# Patient Record
Sex: Female | Born: 1990 | Race: White | Hispanic: No | Marital: Single | Smoking: Never smoker
Health system: Southern US, Community
[De-identification: ages and names within clinical notes are randomized; demographics above are authoritative.]

## PROBLEM LIST (undated history)

## (undated) DIAGNOSIS — R010 Benign and innocent cardiac murmurs: Secondary | ICD-10-CM

## (undated) DIAGNOSIS — K5904 Chronic idiopathic constipation: Secondary | ICD-10-CM

## (undated) HISTORY — DX: Chronic idiopathic constipation: K59.04

## (undated) HISTORY — DX: Benign and innocent cardiac murmurs: R01.0

---
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
## 2012-10-05 ENCOUNTER — Ambulatory Visit (INDEPENDENT_AMBULATORY_CARE_PROVIDER_SITE_OTHER): Payer: BC Managed Care – PPO | Admitting: Internal Medicine

## 2012-10-05 ENCOUNTER — Encounter: Payer: Self-pay | Admitting: Internal Medicine

## 2012-10-05 VITALS — BP 114/60 | HR 99 | Temp 98.4°F | Wt 148.0 lb

## 2012-10-05 DIAGNOSIS — R5381 Other malaise: Secondary | ICD-10-CM

## 2012-10-05 DIAGNOSIS — R599 Enlarged lymph nodes, unspecified: Secondary | ICD-10-CM

## 2012-10-05 DIAGNOSIS — R52 Pain, unspecified: Secondary | ICD-10-CM

## 2012-10-05 DIAGNOSIS — R5383 Other fatigue: Secondary | ICD-10-CM

## 2012-10-05 NOTE — Patient Instructions (Signed)
This acts like a viral syndrome like mono but possible another virus causing this .   Fatigue can last for weeks . Fortunately your exam is good today. Get [place] copy of all  Of the labs done  And I can review and give advice about any further action  In the short run.

## 2012-10-05 NOTE — Progress Notes (Signed)
Chief Complaint  Patient presents with  . Fatigue    Ongoing since the beginning of February.  Had a mono test at school which came back negative.  . Generalized Body Aches  . Headache    HPI: Patient comes in today for SDA for  new problem evaluation.  Home on spring break from [place] . However has actually been having problem since early FEbruary .  Was prev evaluated  At school had strep with st and cough?  Reported positive tests.  Had cough up some blood.   bgrp   b strep in throat  And had 2 rounds or antibiotics; amoxicillin and then   z pack?    Felt feverish.   Throat and cough got  Better and unsure if  Continued sx are related to this. Had a cold sx.  And now very tired and achy muscles but no high fever  No joint pain or new rash   ? Swollen glands at times       ROS: See pertinent positives and negatives per HPI.no bleeding gi gu changes hearing vision neuro sx.   No chronic meds    Headache is all over non severe no neuro sx with this and could be related to above no sig depression mood issues  Neg tad other exposures  Past Medical History  Diagnosis Date  . Functional murmur   . 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 No pertinent family history.  History   Social History  . Marital Status: Single    Spouse Name: N/A    Number of Children: N/A  . Years of Education: N/A   Social History Main Topics  . Smoking status: Never Smoker   . Smokeless tobacco: None  . Alcohol Use: None  . Drug Use: None  . Sexually Active: None   Other Topics Concern  . None   Social History Narrative   Single   Sleep 6 hours   Caffeine   HH of 3   Pet dog   Born in [place]    [employer]    Outpatient Encounter Prescriptions as of 10/05/2012  Medication Sig Dispense Refill  . Prenatal Vit-Fe Psac Cmplx-FA (PRENATAL MULTIVITAMIN) 60-1 MG tablet Take 1 tablet by mouth daily with breakfast.        . [DISCONTINUED] norethindrone-ethinyl  estradiol (JUNEL FE 1/20) 1-20 MG-MCG per tablet Take 1 tablet by mouth daily.         No facility-administered encounter medications on file as of 10/05/2012.    EXAM:  BP 114/60  Pulse 99  Temp(Src) 98.4 F (36.9 C) (Oral)  Wt 148 lb (67.132 kg)  BMI 24.63 kg/m2  SpO2 97%  LMP 09/18/2012  Body mass index is 24.63 kg/(m^2).  GENERAL: vitals reviewed and listed above, alert, oriented, appears well hydrated and in no acute distress  lokoks well but a bit tired non toxic and not chromically ill   HEENT: Normocephalic ;atraumatic , Eyes;  PERRL, EOMs  Full, lids and conjunctiva clear,,Ears: no deformities, canals nl, TM landmarks normal, Nose: no deformity or discharge  Mouth : OP clear without lesion or edema .  NECK: no obvious masses on inspection palpation  Shoddy ac  Nodes   LUNGS: clear to auscultation bilaterally, no wheezes, rales or rhonchi, good air movement  CV: HRRR, no g or m  no clubbing cyanosis or  peripheral edema nl cap refill  Abdomen:  Sof,t normal bowel sounds without hepatosplenomegaly, no guarding rebound or masses no CVA tenderness MS: moves all extremities without noticeable focal  Abnormality no redness or warmth  Skin: normal capillary refill ,turgor , color: No acute rashes ,petechiae or bruising NEURO: oriented x 3 CN 3-12 appear intact. No focal muscle weakness or atrophy. DTRs symmetrical. Gait WNL.  Grossly non focal. No tremor or abnormal movement.  PSYCH: pleasant and cooperative, no obvious depression or anxiety  ASSESSMENT AND PLAN:  Discussed the following assessment and plan:  Other malaise and fatigue - Plan: CANCELED: POC Mono (Epstein Barr Virus)  Body aches  Glands swollen - Plan: CANCELED: POC Mono (Epstein Barr Virus) Exam is unrevealing and reassuring . No sig adenopathy noted  This sounds mono like     But apparently has had extensive blood work done     rx for strep and other   Sounds more likea  Post viral illness.  In lieu of  more blood tests today   ( she reviewed some on the phone  From [facility] but no numbers viewable to me.  ) get copy of labs sent to [place] for review and I can give opino about other evaluations.    Although she was rx for strep there is no sx exam cw post strep sequelae -Patient advised to return or notify health care team  if symptoms worsen or persist or new concerns arise.  Patient Instructions  This acts like a viral syndrome like mono but possible another virus causing this .   Fatigue can last for weeks . Fortunately your exam is good today. Get [place] copy of all  Of the labs done  And I can review and give advice about any further action  In the short run.      Neta Mends. Panosh M.D.

## 2012-10-10 ENCOUNTER — Encounter: Payer: Self-pay | Admitting: Internal Medicine

## 2012-10-21 ENCOUNTER — Telehealth: Payer: Self-pay | Admitting: Internal Medicine

## 2012-10-21 NOTE — Telephone Encounter (Signed)
Pls advise.  

## 2012-10-21 NOTE — Telephone Encounter (Signed)
Please see what i wrot on the lab papers  Sent to [facility]    There was no ebv serologies  Done and other labs not there

## 2012-10-21 NOTE — Telephone Encounter (Signed)
Orders faxed to [facility]

## 2012-10-21 NOTE — Telephone Encounter (Signed)
Called and spoke with pt and advised that per Dr Fabian Sharp pt did not have ebv serologies performed and she would have requested BMP, TSH, and LFTS. Advised pt to ask [facility] if this can be done there and to get a fax number.  Advised pt to call back and give fax number to office.

## 2012-10-21 NOTE — Telephone Encounter (Signed)
MOM Erica Mckenzie called to verify MD would order through [facility] what labs she wanted, reviewed note in EPIC, and that Erica Mckenzie had provided fax number for use to [facility].

## 2012-10-21 NOTE — Telephone Encounter (Signed)
Here is the information requested.

## 2012-10-21 NOTE — Telephone Encounter (Signed)
Lab orders faxed to [facility].

## 2012-10-21 NOTE — Telephone Encounter (Signed)
Pt doesn't know who she just spoke with. She stated whomever it was, requested for her to get them this Fax # - [phone] [facility]. If it was you, please assist.

## 2012-10-21 NOTE — Telephone Encounter (Signed)
lPatient Information:  Caller Name: Erica Mckenzie  Phone: [phone]  Patient: Erica Mckenzie, Erica Mckenzie  Gender: Female  DOB: [birth year]  Age: 22 Years  PCP: Berniece Andreas [facility])   Does the office need to follow up with this patient?: Yes  Instructions For The Office: recommendations after review of records from [facility]  RN Note:  states has not heard back regarding labs that were done and requested at last office visit   Reason For Call & Symptoms: review of blood work done [facility], brought to office,  due to being tired and achy, no improvement, using Monistat for yeast around mouth, soreness with walking, weight gain of 5 lbs in past week.   Reviewed Health History In EMR: Yes  Reviewed Medications In EMR: Yes  Reviewed Allergies In EMR: Yes  Reviewed Surgeries / Procedures: Yes  Date of Onset of Symptoms: 09/22/2012 GYN:  LMP: Unknown  Guideline(s) Used:  No Protocol Available - Sick Adult  Disposition Per Guideline:  Discuss with PCP and Callback by Nurse Today  Reason For Disposition Reached:  Nursing judgment  Advice Given:  Call Back If:  New symptoms develop;  You become worse.  Patient Will Follow Care Advice: Verbalizes understanding

## 2012-10-21 NOTE — Telephone Encounter (Signed)
Caller: Sandra/Mother; Phone: [phone]; Reason for Call: Mom calling for reassurance that office is following up on earlier call.  Informed nurse Bonita Quin, she will follow up w/mom.

## 2012-10-28 ENCOUNTER — Telehealth: Payer: Self-pay | Admitting: Internal Medicine

## 2012-10-28 NOTE — Telephone Encounter (Signed)
Patient called stating that she would like a call back with results from the test results sent over by her school. Please assist.

## 2012-10-28 NOTE — Telephone Encounter (Signed)
Received labs which show a normal thyroid test and her mono antibodies were all negative which means she has never had mononucleosis by EBV her liver and kidney functions are also normal.   This is reassuring and still doesn't give [place] a diagnosis of why she feels badly.  Her symptoms really sounded like a mono which are negative by tests   if she is having recurrent swollen glands and sore throat we may get ear nose and throat Dr. To see her but I would rather see her physically again before referral.  Please have her documented she is still getting fevers at night and what temperature that would be.

## 2012-10-29 NOTE — Telephone Encounter (Signed)
Spoke to the pt and informed her of test results.  She will speak to her mom and make an appt with Dr. Fabian Sharp before referral to ENT.  She will call back to make appt.

## 2012-11-03 ENCOUNTER — Telehealth: Payer: Self-pay | Admitting: Internal Medicine

## 2012-11-03 NOTE — Telephone Encounter (Signed)
Rec'd from [facility] forward 40 pages to [facility] 11/03/12

## 2014-01-21 ENCOUNTER — Other Ambulatory Visit: Payer: BC Managed Care – PPO

## 2014-01-26 ENCOUNTER — Encounter: Payer: BC Managed Care – PPO | Admitting: Internal Medicine

## 2014-03-24 ENCOUNTER — Encounter: Payer: Self-pay | Admitting: Family Medicine

## 2014-03-24 ENCOUNTER — Ambulatory Visit (INDEPENDENT_AMBULATORY_CARE_PROVIDER_SITE_OTHER): Payer: BC Managed Care – PPO | Admitting: Family Medicine

## 2014-03-24 VITALS — BP 122/76 | HR 66 | Ht 66.0 in | Wt 154.0 lb

## 2014-03-24 DIAGNOSIS — M7062 Trochanteric bursitis, left hip: Secondary | ICD-10-CM

## 2014-03-24 DIAGNOSIS — M76899 Other specified enthesopathies of unspecified lower limb, excluding foot: Secondary | ICD-10-CM

## 2014-03-24 NOTE — Patient Instructions (Signed)
Very nice to meet you Ice 20 minutes 2 times daily. Usually after activity and before bed. Try the pennsaid twice daily for next 3 days then as needed.  Home exercises 3 times a week.  Make sure iron  daily.  See me again in 3 weeks.

## 2014-03-24 NOTE — Assessment & Plan Note (Signed)
The patient does have a mild case of bursitis. We discussed that this is mostly secondary to muscle imbalances. Patient was given home exercises the patient will do a regular basis. We discussed icing protocol as well as over-the-counter medications and to be helpful. Patient was given a trial of some topical anti-inflammatories. Patient will try these changes and come back and see me again in 3 weeks for further evaluation.

## 2014-03-24 NOTE — Progress Notes (Signed)
  [facility] [address] [place], [place] [address] Phone: [phone] Subjective:      CC: Left hip pain  VHQ:[id number] Erica Mckenzie is a 23 y.o. female coming in with complaint of left hip pain. Patient states that she's had this pain intermittently for quite some time recently and seem to be worsening. Patient states it is on the lateral aspect of the hip and denies any groin or back pain associated with it. Patient notices it more when sitting. Patient states sometimes with walking or running he can give her some mild discomfort. Patient denies any waking up at night. Patient has tried ibuprofen which has been helpful. Patient is not taking any medications and denies possibility of being pregnant. Patient denies any association with food and denies any radiation down the leg or any numbness or weakness. Severity 5/10.     Past medical history, social, surgical and family history all reviewed in electronic medical record.   Review of Systems: No headache, visual changes, nausea, vomiting, diarrhea, constipation, dizziness, abdominal pain, skin rash, fevers, chills, night sweats, weight loss, swollen lymph nodes, body aches, joint swelling, muscle aches, chest pain, shortness of breath, mood changes.   Objective Blood pressure 122/76, pulse 66, height  (1.676 m), weight 154 lb (69.854 kg), SpO2 98.00%.  General: No apparent distress alert and oriented x3 mood and affect normal, dressed appropriately.  HEENT: Pupils equal, extraocular movements intact  Respiratory: Patient's speak in full sentences and does not appear short of breath  Cardiovascular: No lower extremity edema, non tender, no erythema  Skin: Warm dry intact with no signs of infection or rash on extremities or on axial skeleton.  Abdomen: Soft nontender  Neuro: Cranial nerves II through XII are intact, neurovascularly intact in all extremities with 2+ DTRs and 2+ pulses.  Lymph: No  lymphadenopathy of posterior or anterior cervical chain or axillae bilaterally.  Gait normal with good balance and coordination.  MSK:  Non tender with full range of motion and good stability and symmetric strength and tone of shoulders, elbows, wrist,  knee and ankles bilaterally.  Hip: left ROM IR: 35 Deg, ER: 35 Deg, Flexion: 120 Deg, Extension: 100 Deg, Abduction: 45 Deg, Adduction: 45 Deg Strength IR: 5/5, ER: 5/5, Flexion: 5/5, Extension: 5/5, Abduction: 5/5, Adduction: 5/5 Pelvic alignment unremarkable to inspection and palpation. Standing hip rotation and gait without trendelenburg sign / unsteadiness. Greater trochanter with moderate tenderness.  No tenderness over piriformis and greater trochanter. No pain with FABER or FADIR. No SI joint tenderness and normal minimal SI movement.    Impression and Recommendations:     This case required medical decision making of moderate complexity.

## 2014-04-06 ENCOUNTER — Other Ambulatory Visit (INDEPENDENT_AMBULATORY_CARE_PROVIDER_SITE_OTHER): Payer: BC Managed Care – PPO

## 2014-04-06 DIAGNOSIS — R5381 Other malaise: Secondary | ICD-10-CM

## 2014-04-06 DIAGNOSIS — Z0000 Encounter for general adult medical examination without abnormal findings: Secondary | ICD-10-CM

## 2014-04-06 DIAGNOSIS — R5383 Other fatigue: Secondary | ICD-10-CM

## 2014-04-06 LAB — BASIC METABOLIC PANEL
BUN: 9 mg/dL (ref 6–23)
CALCIUM: 9.2 mg/dL (ref 8.4–10.5)
CO2: 27 mEq/L (ref 19–32)
CREATININE: 0.6 mg/dL (ref 0.4–1.2)
Chloride: 107 mEq/L (ref 96–112)
GFR: 146.21 mL/min (ref >60.00)
GLUCOSE: 77 mg/dL (ref 70–99)
Potassium: 4.2 mEq/L (ref 3.5–5.1)
SODIUM: 142 meq/L (ref 135–145)

## 2014-04-06 LAB — TSH: TSH: 2.3 u[IU]/mL (ref 0.35–4.50)

## 2014-04-06 LAB — HEPATIC FUNCTION PANEL
ALT: 19 U/L (ref 0–35)
AST: 28 U/L (ref 0–37)
Albumin: 4 g/dL (ref 3.5–5.2)
Alkaline Phosphatase: 52 U/L (ref 39–117)
BILIRUBIN DIRECT: 0.1 mg/dL (ref 0.0–0.3)
BILIRUBIN TOTAL: 0.4 mg/dL (ref 0.2–1.2)
Total Protein: 7.2 g/dL (ref 6.0–8.3)

## 2014-04-06 LAB — LIPID PANEL
CHOLESTEROL: 159 mg/dL (ref 0–200)
HDL: 43.6 mg/dL (ref >39.00)
LDL Cholesterol: 103 mg/dL — ABNORMAL HIGH (ref 0–99)
NonHDL: 115.4
Total CHOL/HDL Ratio: 4
Triglycerides: 61 mg/dL (ref 0.0–149.0)
VLDL: 12.2 mg/dL (ref 0.0–40.0)

## 2014-04-06 LAB — CBC WITH DIFFERENTIAL/PLATELET
BASOS PCT: 0.7 % (ref 0.0–3.0)
Basophils Absolute: 0 10*3/uL (ref 0.0–0.1)
EOS ABS: 0.1 10*3/uL (ref 0.0–0.7)
Eosinophils Relative: 2 % (ref 0.0–5.0)
HCT: 39.9 % (ref 36.0–46.0)
Hemoglobin: 13.4 g/dL (ref 12.0–15.0)
LYMPHS ABS: 2.6 10*3/uL (ref 0.7–4.0)
Lymphocytes Relative: 44.4 % (ref 12.0–46.0)
MCHC: 33.6 g/dL (ref 30.0–36.0)
MCV: 86.5 fl (ref 78.0–100.0)
MONO ABS: 0.5 10*3/uL (ref 0.1–1.0)
Monocytes Relative: 7.9 % (ref 3.0–12.0)
NEUTROS PCT: 45 % (ref 43.0–77.0)
Neutro Abs: 2.7 10*3/uL (ref 1.4–7.7)
PLATELETS: 249 10*3/uL (ref 150.0–400.0)
RBC: 4.61 Mil/uL (ref 3.87–5.11)
RDW: 12.4 % (ref 11.5–15.5)
WBC: 5.9 10*3/uL (ref 4.0–10.5)

## 2014-04-06 LAB — IBC PANEL
Iron: 77 ug/dL (ref 42–145)
SATURATION RATIOS: 19.7 % — AB (ref 20.0–50.0)
Transferrin: 279.1 mg/dL (ref 212.0–360.0)

## 2014-04-06 LAB — FERRITIN: Ferritin: 16.4 ng/mL (ref 10.0–291.0)

## 2014-04-06 NOTE — Addendum Note (Signed)
Addended by: Alfred Levins D on: 04/06/2014 08:50 AM   Modules accepted: Orders

## 2014-04-13 ENCOUNTER — Encounter: Payer: Self-pay | Admitting: Internal Medicine

## 2014-04-13 ENCOUNTER — Ambulatory Visit (INDEPENDENT_AMBULATORY_CARE_PROVIDER_SITE_OTHER): Payer: BC Managed Care – PPO | Admitting: Internal Medicine

## 2014-04-13 VITALS — BP 116/64 | Temp 98.2°F | Ht 65.5 in | Wt 150.6 lb

## 2014-04-13 DIAGNOSIS — E611 Iron deficiency: Secondary | ICD-10-CM | POA: Insufficient documentation

## 2014-04-13 DIAGNOSIS — Z23 Encounter for immunization: Secondary | ICD-10-CM

## 2014-04-13 DIAGNOSIS — D509 Iron deficiency anemia, unspecified: Secondary | ICD-10-CM

## 2014-04-13 DIAGNOSIS — Z0000 Encounter for general adult medical examination without abnormal findings: Secondary | ICD-10-CM

## 2014-04-13 DIAGNOSIS — Z113 Encounter for screening for infections with a predominantly sexual mode of transmission: Secondary | ICD-10-CM

## 2014-04-13 NOTE — Patient Instructions (Signed)
Continue lifestyle intervention healthy eating and exercise . Healthy lifestyle includes : At least 150 minutes of exercise weeks  , weight at healthy levels, which is usually   BMI 19-25. Avoid trans fats and processed foods;  Increase fresh fruits and veges to 5 servings per day. And avoid sweet beverages including tea and juice. Mediterranean diet with olive oil and nuts have been noted to be heart and brain healthy . Avoid tobacco products . Limit  alcohol to  7 per week for women and 14 servings for men.  Get adequate sleep . Wear seat belts . Don't text and drive .   Will notify you  of labs when available. Wellness visit in a year  . Iron level is borderline.  Can take  Multivitamin with iron  Increase iron in diet    Iron-Rich Diet An iron-rich diet contains foods that are good sources of iron. Iron is an important mineral that helps your body produce hemoglobin. Hemoglobin is a protein in red blood cells that carries oxygen to the body's tissues. Sometimes, the iron level in your blood can be low. This may be caused by:  A lack of iron in your diet.  Blood loss.  Times of growth, such as during pregnancy or during a child's growth and development. Low levels of iron can cause a decrease in the number of red blood cells. This can result in iron deficiency anemia. Iron deficiency anemia symptoms include:  Tiredness.  Weakness.  Irritability.  Increased chance of infection. Here are some recommendations for daily iron intake:  Males older than 22 years of age need 8 mg of iron per day.  Women ages 42 to 88 need 18 mg of iron per day.  Pregnant women need 27 mg of iron per day, and women who are over 83 years of age and breastfeeding need 9 mg of iron per day.  Women over the age of 50 need 8 mg of iron per day. SOURCES OF IRON There are 2 types of iron that are found in food: heme iron and nonheme iron. Heme iron is absorbed by the body better than nonheme iron. Heme  iron is found in meat, poultry, and fish. Nonheme iron is found in grains, beans, and vegetables. Heme Iron Sources Food / Iron (mg)  Chicken liver, 3 oz (85 g)/ 10 mg  Beef liver, 3 oz (85 g)/ 5.5 mg  Oysters, 3 oz (85 g)/ 8 mg  Beef, 3 oz (85 g)/ 2 to 3 mg  Shrimp, 3 oz (85 g)/ 2.8 mg  [place], 3 oz (85 g)/ 2 mg  Chicken, 3 oz (85 g) / 1 mg  Fish (tuna, halibut), 3 oz (85 g)/ 1 mg  Pork, 3 oz (85 g)/ 0.9 mg Nonheme Iron Sources Food / Iron (mg)  Ready-to-eat breakfast cereal, iron-fortified / 3.9 to 7 mg  Tofu,  cup / 3.4 mg  Kidney beans,  cup / 2.6 mg  Baked potato with skin / 2.7 mg  Asparagus,  cup / 2.2 mg  Avocado / 2 mg  Dried peaches,  cup / 1.6 mg  Raisins,  cup / 1.5 mg  Soy milk, 1 cup / 1.5 mg  Whole-wheat bread, 1 slice / 1.2 mg  Spinach, 1 cup / 0.8 mg  Broccoli,  cup / 0.6 mg IRON ABSORPTION Certain foods can decrease the body's absorption of iron. Try to avoid these foods and beverages while eating meals with iron-containing foods:  Coffee.  Tea.  Fiber.  Soy. Foods containing vitamin C can help increase the amount of iron your body absorbs from iron sources, especially from nonheme sources. Eat foods with vitamin C along with iron-containing foods to increase your iron absorption. Foods that are high in vitamin C include many fruits and vegetables. Some good sources are:  Fresh orange juice.  Oranges.  Strawberries.  Mangoes.  Grapefruit.  Red bell peppers.  Green bell peppers.  Broccoli.  Potatoes with skin.  Tomato juice. Document Released: 02/26/2005 Document Revised: 10/07/2011 Document Reviewed: 01/03/2011 [facility] Patient Information 2015 [place], [place]. This information is not intended to replace advice given to you by your health care provider. Make sure you discuss any questions you have with your health care provider.

## 2014-04-13 NOTE — Progress Notes (Signed)
Pre visit review using our clinic review tool, if applicable. No additional management support is needed unless otherwise documented below in the visit note.  Chief Complaint  Patient presents with  . Annual Exam    HPI: Patient comes in today for Preventive Health Care visit . Just graduated from [employer] back at home looking for employment. On ocps relationship ended 1.5 years  No sx .periods ok  Hx of iron deficiency Had pap last year gyne Health Maintenance  Topic Date Due  . Influenza Vaccine  03/30/2015 (Originally 02/26/2014)  . Pap Smear  02/27/2016  . Tetanus/tdap  04/13/2024   Health Maintenance Review LIFESTYLE:  Exercise:  Working with trainer   Trying to lose  From college  Tobacco/ETS:  no Alcohol:  ocass beer  Sugar beverages: ocass.  Sleep: 7-8 hours  Drug use: no  ROS:  Some bloated  Esp with red meat..  Periods regular  3 days .  GEN/ HEENT: No fever, significant weight changes sweats headaches vision problems hearing changes, CV/ PULM; No chest pain shortness of breath cough, syncope,edema  change in exercise tolerance. GI /GU: No adominal pain, vomiting, change in bowel habits. No blood in the stool. No significant GU symptoms. SKIN/HEME: ,no acute skin rashes suspicious lesions or bleeding. No lymphadenopathy, nodules, masses.  NEURO/ PSYCH:  No neurologic signs such as weakness numbness. No depression anxiety. IMM/ Allergy: No unusual infections.  Allergy .   REST of 12 system review negative except as per HPI   Past Medical History  Diagnosis Date  . Functional murmur   . 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 No pertinent family history.  History   Social History  . Marital Status: Single    Spouse Name: N/A    Number of Children: N/A  . Years of Education: N/A   Social History Main Topics  . Smoking status: Never Smoker   . Smokeless tobacco: None  . Alcohol Use: None  . Drug Use: None  . Sexual Activity:  None   Other Topics Concern  . 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 JUNEL FE 1.5/30 1.5-30 MG-MCG tablet   . [DISCONTINUED] Prenatal Vit-Fe Psac Cmplx-FA (PRENATAL MULTIVITAMIN) 60-1 MG tablet Take 1 tablet by mouth daily with breakfast.      EXAM:  BP 116/64  Temp(Src) 98.2 F (36.8 C) (Oral)  Ht 5' 5.5" (1.664 m)  Wt 150 lb 9.6 oz (68.312 kg)  BMI 24.67 kg/m2  Body mass index is 24.67 kg/(m^2).  Physical Exam: Vital signs reviewed [id number]:WRUE is a well-developed well-nourished alert cooperative    who appearsr stated age in no acute distress.  HEENT: normocephalic atraumatic , Eyes: PERRL EOM's full, conjunctiva clear, Nares: paten,t no deformity discharge or tenderness., Ears: no deformity EAC's clear TMs with normal landmarks. Mouth: clear OP, no lesions, edema.  Moist mucous membranes. Dentition in adequate repair. NECK: supple without masses, thyromegaly or bruits. CHEST/PULM:  Clear to auscultation and percussion breath sounds equal no wheeze , rales or rhonchi. No chest wall deformities or tenderness. CV: PMI is nondisplaced, S1 S2 no gallops, murmurs, rubs. Peripheral pulses are full without delay.No JVD .  Breast: normal by inspection . No dimpling, discharge, masses, tenderness or discharge . ABDOMEN: Bowel sounds normal nontender  No guard or rebound, no hepato splenomegal no CVA tenderness.  No hernia. Extremtities:  No clubbing cyanosis or edema, no acute joint swelling or redness no focal atrophy NEURO:  Oriented x3, cranial nerves 3-12 appear to be intact, no obvious focal weakness,gait within normal limits no abnormal reflexes or asymmetrical SKIN: No acute rashes normal turgor, color, no bruising or petechiae. PSYCH: Oriented, good eye contact, no obvious  depression anxiety, cognition and judgment appear normal. LN: no cervical axillary inguinal adenopathy   Lab Results  Component Value Date   WBC 5.9 04/06/2014   HGB 13.4 04/06/2014   HCT 39.9 04/06/2014   PLT 249.0 04/06/2014   GLUCOSE 77 04/06/2014   CHOL 159 04/06/2014   TRIG 61.0 04/06/2014   HDL 43.60 04/06/2014   LDLCALC 103* 04/06/2014   ALT 19 04/06/2014   AST 28 04/06/2014   NA 142 04/06/2014   K 4.2 04/06/2014   CL 107 04/06/2014   CREATININE 0.6 04/06/2014   BUN 9 04/06/2014   CO2 27 04/06/2014   TSH 2.30 04/06/2014    ASSESSMENT AND PLAN:  Discussed the following assessment and plan:  Visit for preventive health examination - utd lab reviewed   Need for tetanus booster - Plan: Td vaccine greater than or equal to 7yo preservative free IM  Screening for STD (sexually transmitted disease) - Plan: GC/chlamydia probe amp, urine  Iron deficiency - minimal not eating red meat .   Patient Care Team: Madelin Headings, MD as PCP - General Patient Instructions  Continue lifestyle intervention healthy eating and exercise . Healthy lifestyle includes : At least 150 minutes of exercise weeks  , weight at healthy levels, which is usually   BMI 19-25. Avoid trans fats and processed foods;  Increase fresh fruits and veges to 5 servings per day. And avoid sweet beverages including tea and juice. Mediterranean diet with olive oil and nuts have been noted to be heart and brain healthy . Avoid tobacco products . Limit  alcohol to  7 per week for women and 14 servings for men.  Get adequate sleep . Wear seat belts . Don't text and drive .   Will notify you  of labs when available. Wellness visit in a year  . Iron level is borderline.  Can take  Multivitamin with iron  Increase iron in diet    Iron-Rich Diet An iron-rich diet contains foods that are good sources of iron. Iron is an important mineral that helps your body produce hemoglobin. Hemoglobin is a protein in red blood cells that carries oxygen to the  body's tissues. Sometimes, the iron level in your blood can be low. This may be caused by:  A lack of iron in your diet.  Blood loss.  Times of growth, such as during pregnancy or during a child's growth and development. Low levels of iron can cause a decrease in the number of red blood cells. This can result in iron deficiency anemia. Iron deficiency anemia symptoms include:  Tiredness.  Weakness.  Irritability.  Increased chance of infection. Here are some recommendations for daily iron intake:  Males older than 22 years of age need 8 mg of iron per day.  Women ages 59 to 13 need 18 mg of iron per day.  Pregnant women need 27 mg of iron per day, and women who are over 38 years of age and  breastfeeding need 9 mg of iron per day.  Women over the age of 24 need 8 mg of iron per day. SOURCES OF IRON There are 2 types of iron that are found in food: heme iron and nonheme iron. Heme iron is absorbed by the body better than nonheme iron. Heme iron is found in meat, poultry, and fish. Nonheme iron is found in grains, beans, and vegetables. Heme Iron Sources Food / Iron (mg)  Chicken liver, 3 oz (85 g)/ 10 mg  Beef liver, 3 oz (85 g)/ 5.5 mg  Oysters, 3 oz (85 g)/ 8 mg  Beef, 3 oz (85 g)/ 2 to 3 mg  Shrimp, 3 oz (85 g)/ 2.8 mg  [place], 3 oz (85 g)/ 2 mg  Chicken, 3 oz (85 g) / 1 mg  Fish (tuna, halibut), 3 oz (85 g)/ 1 mg  Pork, 3 oz (85 g)/ 0.9 mg Nonheme Iron Sources Food / Iron (mg)  Ready-to-eat breakfast cereal, iron-fortified / 3.9 to 7 mg  Tofu,  cup / 3.4 mg  Kidney beans,  cup / 2.6 mg  Baked potato with skin / 2.7 mg  Asparagus,  cup / 2.2 mg  Avocado / 2 mg  Dried peaches,  cup / 1.6 mg  Raisins,  cup / 1.5 mg  Soy milk, 1 cup / 1.5 mg  Whole-wheat bread, 1 slice / 1.2 mg  Spinach, 1 cup / 0.8 mg  Broccoli,  cup / 0.6 mg IRON ABSORPTION Certain foods can decrease the body's absorption of iron. Try to avoid these foods and beverages  while eating meals with iron-containing foods:  Coffee.  Tea.  Fiber.  Soy. Foods containing vitamin C can help increase the amount of iron your body absorbs from iron sources, especially from nonheme sources. Eat foods with vitamin C along with iron-containing foods to increase your iron absorption. Foods that are high in vitamin C include many fruits and vegetables. Some good sources are:  Fresh orange juice.  Oranges.  Strawberries.  Mangoes.  Grapefruit.  Red bell peppers.  Green bell peppers.  Broccoli.  Potatoes with skin.  Tomato juice. Document Released: 02/26/2005 Document Revised: 10/07/2011 Document Reviewed: 01/03/2011 [facility] Patient Information 2015 [place], [place]. This information is not intended to replace advice given to you by your health care provider. Make sure you discuss any questions you have with your health care provider.     Neta Mends. Panosh M.D.

## 2014-04-14 ENCOUNTER — Encounter: Payer: Self-pay | Admitting: Family Medicine

## 2014-04-14 ENCOUNTER — Ambulatory Visit (INDEPENDENT_AMBULATORY_CARE_PROVIDER_SITE_OTHER): Payer: BC Managed Care – PPO | Admitting: Family Medicine

## 2014-04-14 VITALS — BP 106/64 | HR 85 | Ht 65.5 in | Wt 150.0 lb

## 2014-04-14 DIAGNOSIS — M76899 Other specified enthesopathies of unspecified lower limb, excluding foot: Secondary | ICD-10-CM

## 2014-04-14 DIAGNOSIS — M7062 Trochanteric bursitis, left hip: Secondary | ICD-10-CM

## 2014-04-14 LAB — GC/CHLAMYDIA PROBE AMP, URINE
Chlamydia, Swab/Urine, PCR: NEGATIVE
GC Probe Amp, Urine: NEGATIVE

## 2014-04-14 NOTE — Progress Notes (Signed)
  [facility] [address] [place], [place] [address] Phone: [phone] Subjective:      CC: Left hip pain  BJY:[id number] Erica Mckenzie is a 23 y.o. female coming in with complaint of left hip pain. Patient was seen previously and did have a mild greater trochanteric bursitis. Patient has been doing the exercises fairly regularly but has not been icing and taking the medication. Patient states though that she is approximately 70% better. Patient states after sitting for long amount of time she can still has some mild discomfort. Patient has started running again and is not having any significant pain. Patient continues to try to avoid any medications because she is contemplating becoming pregnant. Patient states that there may be some association with food.     Past medical history, social, surgical and family history all reviewed in electronic medical record.   Review of Systems: No headache, visual changes, nausea, vomiting, diarrhea, constipation, dizziness, abdominal pain, skin rash, fevers, chills, night sweats, weight loss, swollen lymph nodes, body aches, joint swelling, muscle aches, chest pain, shortness of breath, mood changes.   Objective Blood pressure 106/64, pulse 85, height 5' 5.5" (1.664 m), weight 150 lb (68.04 kg), SpO2 98.00%.  General: No apparent distress alert and oriented x3 mood and affect normal, dressed appropriately.  HEENT: Pupils equal, extraocular movements intact  Respiratory: Patient's speak in full sentences and does not appear short of breath  Cardiovascular: No lower extremity edema, non tender, no erythema  Skin: Warm dry intact with no signs of infection or rash on extremities or on axial skeleton.  Abdomen: Soft nontender  Neuro: Cranial nerves II through XII are intact, neurovascularly intact in all extremities with 2+ DTRs and 2+ pulses.  Lymph: No lymphadenopathy of posterior or anterior cervical chain or axillae  bilaterally.  Gait normal with good balance and coordination.  MSK:  Non tender with full range of motion and good stability and symmetric strength and tone of shoulders, elbows, wrist,  knee and ankles bilaterally.  Hip: left ROM IR: 35 Deg, ER: 35 Deg, Flexion: 120 Deg, Extension: 100 Deg, Abduction: 45 Deg, Adduction: 45 Deg Strength IR: 5/5, ER: 5/5, Flexion: 5/5, Extension: 5/5, Abduction: 5/5, Adduction: 5/5 Pelvic alignment unremarkable to inspection and palpation. Standing hip rotation and gait without trendelenburg sign / unsteadiness. Greater trochanter w nontender No tenderness over piriformis and greater trochanter. No pain with FABER or FADIR. No SI joint tenderness and normal minimal SI movement.    Impression and Recommendations:     This case required medical decision making of moderate complexity.

## 2014-04-14 NOTE — Assessment & Plan Note (Signed)
Patient does have some mild symptoms to left. Encourage her to do the exercises on a regular basis as well as to start icing on a regular basis. We discussed continuing exercises for the next 6 weeks. We discussed the importance of core strengthening as well as the hip abductor and making as part of her regular routine. Patient will come back and see me again in 6 weeks if pain has not completely resolved.

## 2014-04-14 NOTE — Patient Instructions (Signed)
Good to see you If hurting ibuprofen or meloxicam  Keep a food diary and see if there is any association.  Exercises 3 times a week for another 6 weeks Ice is your friend at night/ Come back and see me again in 6 weeks if not perfect.

## 2014-04-15 ENCOUNTER — Encounter: Payer: Self-pay | Admitting: Family Medicine

## 2014-05-30 ENCOUNTER — Ambulatory Visit (INDEPENDENT_AMBULATORY_CARE_PROVIDER_SITE_OTHER): Payer: BC Managed Care – PPO | Admitting: Family Medicine

## 2014-05-30 ENCOUNTER — Encounter: Payer: Self-pay | Admitting: Family Medicine

## 2014-05-30 VITALS — BP 122/82 | HR 76 | Ht 66.0 in | Wt 146.0 lb

## 2014-05-30 DIAGNOSIS — M7062 Trochanteric bursitis, left hip: Secondary | ICD-10-CM

## 2014-05-30 DIAGNOSIS — M2170 Unequal limb length (acquired), unspecified site: Secondary | ICD-10-CM

## 2014-05-30 NOTE — Patient Instructions (Signed)
Good to see you Ice is your friend still after activity Tennis ball back left pocket with sitting Make sure you stretch hip flexor after squatting Heel lift in left shoe If pain is not gone in 4 weeks come back and we will try some osteopathic manipulation.

## 2014-05-30 NOTE — Assessment & Plan Note (Signed)
Patient is doing remarkably well with conservative therapy. Patient was given manual massage techniques think will be helpful as well as we discussed about fixing the leg length discrepancy. Patient will try heel lifts. We also discussed stretching patient's hip flexors. Patient was doing remarkably well and as long as she continues to do well we will see her again in about 4-6 weeks. Patient continues to have any difficulty we'll consider osteopathic manipulation.

## 2014-05-30 NOTE — Progress Notes (Signed)
  [facility] [address] [place], [place] [address] Phone: [phone] Subjective:      CC: Left hip pain follow up  BJY:NWGNFAOZHYHPI:Subjective Erica PicklesJamie Mckenzie is a 23 y.o. female coming in with complaint of left hip pain. Patient was found to have a mild greater trochanteric bursitis that was doing significantly well with conservative therapy. Patient states overall she is approximately 96% better. Patient states that after sitting a long amount of time she has a dull aching sensation on the lateral aspect of the hip with no radiation. This is not stopping her from any activity and she is able to work out on a regular basis. Patient denies any new symptoms.     Past medical history, social, surgical and family history all reviewed in electronic medical record.   Review of Systems: No headache, visual changes, nausea, vomiting, diarrhea, constipation, dizziness, abdominal pain, skin rash, fevers, chills, night sweats, weight loss, swollen lymph nodes, body aches, joint swelling, muscle aches, chest pain, shortness of breath, mood changes.   Objective Blood pressure 122/82, pulse 76, height 5\' 6"  (1.676 m), weight 146 lb (66.225 kg), SpO2 99 %.  General: No apparent distress alert and oriented x3 mood and affect normal, dressed appropriately.  HEENT: Pupils equal, extraocular movements intact  Respiratory: Patient's speak in full sentences and does not appear short of breath  Cardiovascular: No lower extremity edema, non tender, no erythema  Skin: Warm dry intact with no signs of infection or rash on extremities or on axial skeleton.  Abdomen: Soft nontender  Neuro: Cranial nerves II through XII are intact, neurovascularly intact in all extremities with 2+ DTRs and 2+ pulses.  Lymph: No lymphadenopathy of posterior or anterior cervical chain or axillae bilaterally.  Gait normal with good balance and coordination.  MSK:  Non tender with full range of motion and good  stability and symmetric strength and tone of shoulders, elbows, wrist,  knee and ankles bilaterally.  Hip: left ROM IR: 35 Deg, ER: 35 Deg, Flexion: 120 Deg, Extension: 100 Deg, Abduction: 45 Deg, Adduction: 45 Deg Strength IR: 5/5, ER: 5/5, Flexion: 5/5, Extension: 5/5, Abduction: 5/5, Adduction: 5/5 Pelvic alignment unremarkable to inspection and palpation. Standing hip rotation and gait without trendelenburg sign / unsteadiness. Greater trochanter w nontender No tenderness over piriformis and greater trochanter. No pain with FABER or FADIR. No SI joint tenderness and normal minimal SI movement. 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   Impression and Recommendations:     This case required medical decision making of moderate complexity.

## 2014-06-01 ENCOUNTER — Other Ambulatory Visit: Payer: Self-pay | Admitting: Internal Medicine

## 2014-06-01 NOTE — Telephone Encounter (Signed)
Denied.  Last OV states no bc at this time.  No mention of refilling any bc.  Sent a message to the pharmacy to have the pt call the office if a refill is needed.

## 2014-06-03 MED ORDER — NORETHIN ACE-ETH ESTRAD-FE 1.5-30 MG-MCG PO TABS: 1.0000 | ORAL_TABLET | Freq: Every day | ORAL | Status: DC

## 2014-06-03 NOTE — Telephone Encounter (Signed)
Ok to rx  through next welness visit  Sept 2016

## 2014-06-03 NOTE — Addendum Note (Signed)
Addended by: Raj JanusADKINS, Louellen Haldeman T on: 06/03/2014 04:14 PM   Modules accepted: Orders

## 2014-06-03 NOTE — Telephone Encounter (Signed)
Pt called and is requesting a refill.  Originally prescribed by the college.  Please advise.  Thanks!

## 2014-06-03 NOTE — Telephone Encounter (Signed)
Sent to the pharmacy by e-scribe. 

## 2014-06-30 ENCOUNTER — Ambulatory Visit: Payer: BC Managed Care – PPO | Admitting: Family Medicine

## 2015-01-20 ENCOUNTER — Ambulatory Visit (INDEPENDENT_AMBULATORY_CARE_PROVIDER_SITE_OTHER): Payer: BC Managed Care – PPO | Admitting: Family Medicine

## 2015-01-20 ENCOUNTER — Encounter: Payer: Self-pay | Admitting: Family Medicine

## 2015-01-20 ENCOUNTER — Other Ambulatory Visit (INDEPENDENT_AMBULATORY_CARE_PROVIDER_SITE_OTHER): Payer: BC Managed Care – PPO

## 2015-01-20 VITALS — BP 104/72 | HR 82 | Ht 66.0 in | Wt 154.0 lb

## 2015-01-20 DIAGNOSIS — M65842 Other synovitis and tenosynovitis, left hand: Secondary | ICD-10-CM

## 2015-01-20 DIAGNOSIS — M25531 Pain in right wrist: Secondary | ICD-10-CM | POA: Diagnosis not present

## 2015-01-20 DIAGNOSIS — M65832 Other synovitis and tenosynovitis, left forearm: Secondary | ICD-10-CM

## 2015-01-20 DIAGNOSIS — M65839 Other synovitis and tenosynovitis, unspecified forearm: Secondary | ICD-10-CM | POA: Insufficient documentation

## 2015-01-20 NOTE — Progress Notes (Signed)
  [facility] [address] [place], [place] [address] Phone: [phone] Subjective:    I'm seeing this patient by the request  of:  Lorretta Harp, MD   CC: Wrist pain, right  [id number] Erica Mckenzie is a 24 y.o. female coming in with complaint of wrist pain on the right side. Patient states that this is been going on intermittently for months but seems to be worsening. Patient states that anytime she extends her wrist with either weight lifting or pushing. Patient denies any true injury. Patient states that her regular daily activities she is not having any significant discomfort. Patient states that it does seem to be worsening. Denies any numbness or weakness. Patient rates the severity of pain as 5 out of 10. Starting to affect her working out. No nighttime awakening.     Past medical history, social, surgical and family history all reviewed in electronic medical record.   Review of Systems: No headache, visual changes, nausea, vomiting, diarrhea, constipation, dizziness, abdominal pain, skin rash, fevers, chills, night sweats, weight loss, swollen lymph nodes, body aches, joint swelling, muscle aches, chest pain, shortness of breath, mood changes.   Objective Blood pressure 104/72, pulse 82, height 5\' 6"  (1.676 m), weight 154 lb (69.854 kg), SpO2 98 %.  General: No apparent distress alert and oriented x3 mood and affect normal, dressed appropriately.  HEENT: Pupils equal, extraocular movements intact  Respiratory: Patient's speak in full sentences and does not appear short of breath  Cardiovascular: No lower extremity edema, non tender, no erythema  Skin: Warm dry intact with no signs of infection or rash on extremities or on axial skeleton.  Abdomen: Soft nontender  Neuro: Cranial nerves II through XII are intact, neurovascularly intact in all extremities with 2+ DTRs and 2+ pulses.  Lymph: No lymphadenopathy of posterior or anterior cervical  chain or axillae bilaterally.  Gait normal with good balance and coordination.  MSK:  Non tender with full range of motion and good stability and symmetric strength and tone of shoulders, elbows, , hip, knee and ankles bilaterally.  Wrist: Right Inspection normal with no visible erythema or swelling. ROM smooth and normal with good flexion and extension and ulnar/radial deviation that is symmetrical with opposite wrist. Palpation is normal over metacarpals, navicular, lunate, and TFCC; tendons without tenderness/ swelling No snuffbox tenderness. No tenderness over Canal of Guyon. Strength 5/5 in all directions without pain. Patient though does have pain with forced extension Negative Finkelstein, tinel's and phalens. Negative Watson's test. Collateral wrist unremarkable.  MSK US performed of: Right wrist This study was ordered, performed, and interpreted by Terrilee Files D.O.  Wrist: All extensor compartments visualized patient does have hypoechoic changes within the third compartment. No tear appreciated  TFCC intact. Scapholunate ligament intact. Carpal tunnel visualized and median nerve area normal, flexor tendons all normal in appearance without fraying, tears, or sheath effusions. Power doppler signal normal.  IMPRESSION:  Extensor tenosynovitis      Impression and Recommendations:     This case required medical decision making of moderate complexity.

## 2015-01-20 NOTE — Patient Instructions (Signed)
Good to see you Ice 20 minutes 2 times daily. Usually after activity and before bed. Exercises 3 times a week.  Lift with thumbs up Elbows up with typing. Puppet master pennsaid pinkie amount topically 2 times daily as needed.  See me again in 3-4 weeks.

## 2015-01-20 NOTE — Assessment & Plan Note (Signed)
Believe second due to patient's typing as well as patient's lifting and yoga is likely contributed into the tenosynovitis. We discussed icing, topical anti-inflammatory's, home exercises. We discussed possibly changing different positioning with her daily activities that I think will be beneficial. Patient try these different changes and come back and see me again in 3 weeks for further evaluation and treatment.  Spent  25 minutes with patient face-to-face and had greater than 50% of counseling including as described above in assessment and plan.

## 2015-01-20 NOTE — Progress Notes (Signed)
Pre visit review using our clinic review tool, if applicable. No additional management support is needed unless otherwise documented below in the visit note. 

## 2015-02-22 ENCOUNTER — Ambulatory Visit: Payer: BC Managed Care – PPO | Admitting: Family Medicine

## 2015-03-08 ENCOUNTER — Other Ambulatory Visit: Payer: Self-pay | Admitting: Internal Medicine

## 2015-04-14 ENCOUNTER — Other Ambulatory Visit (INDEPENDENT_AMBULATORY_CARE_PROVIDER_SITE_OTHER): Payer: BC Managed Care – PPO

## 2015-04-14 DIAGNOSIS — Z0000 Encounter for general adult medical examination without abnormal findings: Secondary | ICD-10-CM | POA: Diagnosis not present

## 2015-04-14 LAB — CBC WITH DIFFERENTIAL/PLATELET
BASOS PCT: 0.6 % (ref 0.0–3.0)
Basophils Absolute: 0 10*3/uL (ref 0.0–0.1)
EOS ABS: 0.1 10*3/uL (ref 0.0–0.7)
EOS PCT: 0.9 % (ref 0.0–5.0)
HEMATOCRIT: 39.8 % (ref 36.0–46.0)
Hemoglobin: 13.5 g/dL (ref 12.0–15.0)
LYMPHS PCT: 31.6 % (ref 12.0–46.0)
Lymphs Abs: 2.5 10*3/uL (ref 0.7–4.0)
MCHC: 34 g/dL (ref 30.0–36.0)
MCV: 85.6 fl (ref 78.0–100.0)
Monocytes Absolute: 0.5 10*3/uL (ref 0.1–1.0)
Monocytes Relative: 6.9 % (ref 3.0–12.0)
NEUTROS ABS: 4.7 10*3/uL (ref 1.4–7.7)
Neutrophils Relative %: 60 % (ref 43.0–77.0)
PLATELETS: 244 10*3/uL (ref 150.0–400.0)
RBC: 4.65 Mil/uL (ref 3.87–5.11)
RDW: 12.3 % (ref 11.5–15.5)
WBC: 7.9 10*3/uL (ref 4.0–10.5)

## 2015-04-14 LAB — LIPID PANEL
CHOLESTEROL: 141 mg/dL (ref 0–200)
HDL: 48.5 mg/dL (ref >39.00)
LDL Cholesterol: 79 mg/dL (ref 0–99)
NonHDL: 92.86
TRIGLYCERIDES: 70 mg/dL (ref 0.0–149.0)
Total CHOL/HDL Ratio: 3
VLDL: 14 mg/dL (ref 0.0–40.0)

## 2015-04-14 LAB — HEPATIC FUNCTION PANEL
ALBUMIN: 4.4 g/dL (ref 3.5–5.2)
ALT: 17 U/L (ref 0–35)
AST: 20 U/L (ref 0–37)
Alkaline Phosphatase: 55 U/L (ref 39–117)
BILIRUBIN DIRECT: 0.1 mg/dL (ref 0.0–0.3)
TOTAL PROTEIN: 7.4 g/dL (ref 6.0–8.3)
Total Bilirubin: 0.4 mg/dL (ref 0.2–1.2)

## 2015-04-14 LAB — BASIC METABOLIC PANEL
BUN: 11 mg/dL (ref 6–23)
CHLORIDE: 103 meq/L (ref 96–112)
CO2: 28 meq/L (ref 19–32)
CREATININE: 0.59 mg/dL (ref 0.40–1.20)
Calcium: 9.8 mg/dL (ref 8.4–10.5)
GFR: 133.62 mL/min (ref >60.00)
Glucose, Bld: 83 mg/dL (ref 70–99)
POTASSIUM: 4.5 meq/L (ref 3.5–5.1)
Sodium: 138 mEq/L (ref 135–145)

## 2015-04-14 LAB — TSH: TSH: 2 u[IU]/mL (ref 0.35–4.50)

## 2015-04-21 ENCOUNTER — Ambulatory Visit (INDEPENDENT_AMBULATORY_CARE_PROVIDER_SITE_OTHER): Payer: BC Managed Care – PPO | Admitting: Internal Medicine

## 2015-04-21 ENCOUNTER — Encounter: Payer: Self-pay | Admitting: Internal Medicine

## 2015-04-21 VITALS — BP 122/80 | Temp 98.4°F | Ht 66.0 in | Wt 148.1 lb

## 2015-04-21 DIAGNOSIS — Z113 Encounter for screening for infections with a predominantly sexual mode of transmission: Secondary | ICD-10-CM

## 2015-04-21 DIAGNOSIS — IMO0002 Reserved for concepts with insufficient information to code with codable children: Secondary | ICD-10-CM

## 2015-04-21 DIAGNOSIS — N941 Dyspareunia: Secondary | ICD-10-CM

## 2015-04-21 DIAGNOSIS — Z3041 Encounter for surveillance of contraceptive pills: Secondary | ICD-10-CM

## 2015-04-21 DIAGNOSIS — Z0000 Encounter for general adult medical examination without abnormal findings: Secondary | ICD-10-CM

## 2015-04-21 LAB — POCT URINALYSIS DIPSTICK
BILIRUBIN UA: NEGATIVE
Blood, UA: NEGATIVE
GLUCOSE UA: NEGATIVE
LEUKOCYTES UA: NEGATIVE
NITRITE UA: NEGATIVE
PH UA: 6
Protein, UA: NEGATIVE
Spec Grav, UA: 1.025
Urobilinogen, UA: 0.2

## 2015-04-21 NOTE — Progress Notes (Signed)
Pre visit review using our clinic review tool, if applicable. No additional management support is needed unless otherwise documented below in the visit note.  Chief Complaint  Patient presents with  . Annual Exam    HPI: Patient  Erica Mckenzie  24 y.o. comes in today for Preventive Health Care visit  Now works  [employer]  40 week.  Lives at home  New health concern  Vaginal dyspareunia and some bloating   For 6 months   No dc uti sx  Fever other abd pain  Last pap ? Dr Mariana Arn office  In 2014  Not sure if constipated   Health Maintenance  Topic Date Due  . HIV Screening  09/29/2006  . INFLUENZA VACCINE  04/19/2016 (Originally 02/27/2015)  . PAP SMEAR  02/27/2016  . TETANUS/TDAP  04/13/2024   Health Maintenance Review LIFESTYLE:  Exercise:  Sometimes  Wrist   Tobacco/ETS:no Alcohol:  1 per week  Sugar beverages: no Sleep: ave 7  Drug use: no LMP  : early September .   Regular mostly  One missed . 1 partner a year  No condoms  Had hiv screen in past  ROS:  GEN/ HEENT: No fever, significant weight changes sweats headaches vision problems hearing changes, CV/ PULM; No chest pain shortness of breath cough, syncope,edema  change in exercise tolerance. GI /GU: See above  No adominal pain, vomiting, change in bowel habits. No blood in the stool. SKIN/HEME: ,no acute skin rashes suspicious lesions or bleeding. No lymphadenopathy, nodules, masses.  NEURO/ PSYCH:  No neurologic signs such as weakness numbness. No depression anxiety. IMM/ Allergy: No unusual infections.  Allergy .   REST of 12 system review negative except as per HPI   Past Medical History  Diagnosis Date  . Functional murmur   . 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  Family History  Problem Relation Age of Onset  . Healthy Mother     Social History   Social History  . Marital Status: Single    Spouse Name: N/A  . Number of Children: N/A  . Years of Education: N/A    Social History Main Topics  . Smoking status: Never Smoker   . Smokeless tobacco: None  . Alcohol Use: None  . Drug Use: None  . Sexual Activity: Not Asked   Other Topics Concern  . 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 JUNEL FE 1.5/30 1.5-30 MG-MCG tablet TAKE 1 TABLET BY MOUTH DAILY. 28 tablet 1   No facility-administered medications prior to visit.     EXAM:  BP 122/80 mmHg  Temp(Src) 98.4 F (36.9 C) (Oral)  Ht  (1.676 m)  Wt 148 lb 1.6 oz (67.178 kg)  BMI 23.92 kg/m2  Body mass index is 23.92 kg/(m^2).  Physical Exam: Vital signs reviewed WUJ:WJXB is a well-developed well-nourished alert cooperative    who appearsr stated age in no acute distress.  HEENT: normocephalic atraumatic , Eyes: PERRL EOM's full, conjunctiva clear, Nares: paten,t no deformity discharge or tenderness., Ears: no deformity EAC's clear TMs with normal landmarks. Mouth:  clear OP, no lesions, edema.  Moist mucous membranes. Dentition in adequate repair. NECK: supple without masses, thyromegaly or bruits. CHEST/PULM:  Clear to auscultation and percussion breath sounds equal no wheeze , rales or rhonchi. No chest wall deformities or tenderness.Breast: normal by inspection . No dimpling, discharge, masses, tenderness or discharge . CV: PMI is nondisplaced, S1 S2 no gallops, murmurs, rubs. Peripheral pulses are full without delay.No JVD .  ABDOMEN: Bowel sounds normal nontender  No guard or rebound, no hepato splenomegal no CVA tenderness. GU ext gu normal no adenopathy no dc  Extremtities:  No clubbing cyanosis or edema, no acute joint swelling or redness no focal atrophy NEURO:  Oriented x3, cranial nerves 3-12 appear to be intact, no obvious focal  weakness,gait within normal limits no abnormal reflexes or asymmetrical SKIN: No acute rashes normal turgor, color, no bruising or petechiae. PSYCH: Oriented, good eye contact, no obvious depression anxiety, cognition and judgment appear normal. LN: no cervical axillary inguinal adenopathy  Lab Results  Component Value Date   WBC 7.9 04/14/2015   HGB 13.5 04/14/2015   HCT 39.8 04/14/2015   PLT 244.0 04/14/2015   GLUCOSE 83 04/14/2015   CHOL 141 04/14/2015   TRIG 70.0 04/14/2015   HDL 48.50 04/14/2015   LDLCALC 79 04/14/2015   ALT 17 04/14/2015   AST 20 04/14/2015   NA 138 04/14/2015   K 4.5 04/14/2015   CL 103 04/14/2015   CREATININE 0.59 04/14/2015   BUN 11 04/14/2015   CO2 28 04/14/2015   TSH 2.00 04/14/2015   Adult vaccines due  Topic Date Due  . TETANUS/TDAP  04/13/2024     ASSESSMENT AND PLAN:  Discussed the following assessment and plan:  Visit for preventive health examination - Plan: GC/chlamydia probe amp, urine, POCT urinalysis dipstick  Routine screening for STI (sexually transmitted infection) - Plan: GC/chlamydia probe amp, urine, POCT urinalysis dipstick  Dyspareunia - ext exam looks normal  vaginal / dsic diff dx refer back to he gyne  - Plan: Ambulatory referral to Gynecology  Oral contraceptive use  Patient Care Team: Madelin Headings, MD as PCP - General Patient Instructions  Exam is good. Healthy eating to avoid constipation. Will notify you  of labs when available.  Will do a referral to Dr. Mariana Arn office about the dyspareunia sx.etc  ? If change ocps would help.       Neta Mends. Panosh M.D.

## 2015-04-21 NOTE — Patient Instructions (Signed)
Exam is good. Healthy eating to avoid constipation. Will notify you  of labs when available.  Will do a referral to Dr. Mariana Arn office about the dyspareunia sx.etc  ? If change ocps would help.

## 2015-04-22 LAB — GC/CHLAMYDIA PROBE AMP, URINE
Chlamydia, Swab/Urine, PCR: NEGATIVE
GC PROBE AMP, URINE: NEGATIVE

## 2015-05-03 ENCOUNTER — Other Ambulatory Visit: Payer: Self-pay | Admitting: Internal Medicine

## 2015-05-03 NOTE — Telephone Encounter (Signed)
Sent to the pharmacy by e-scribe. 

## 2016-01-08 ENCOUNTER — Other Ambulatory Visit: Payer: Self-pay | Admitting: Internal Medicine

## 2016-01-08 NOTE — Telephone Encounter (Signed)
Rx refill sent to pharmacy. 

## 2016-06-24 ENCOUNTER — Encounter: Payer: Self-pay | Admitting: Internal Medicine

## 2016-06-24 ENCOUNTER — Ambulatory Visit (INDEPENDENT_AMBULATORY_CARE_PROVIDER_SITE_OTHER): Payer: BC Managed Care – PPO | Admitting: Internal Medicine

## 2016-06-24 VITALS — BP 130/86 | Temp 98.1°F | Wt 153.4 lb

## 2016-06-24 DIAGNOSIS — J069 Acute upper respiratory infection, unspecified: Secondary | ICD-10-CM | POA: Diagnosis not present

## 2016-06-24 DIAGNOSIS — J019 Acute sinusitis, unspecified: Secondary | ICD-10-CM | POA: Diagnosis not present

## 2016-06-24 MED ORDER — AMOXICILLIN-POT CLAVULANATE 875-125 MG PO TABS: 1.0000 | ORAL_TABLET | Freq: Two times a day (BID) | ORAL | 0 refills | Status: DC

## 2016-06-24 NOTE — Progress Notes (Signed)
Pre visit review using our clinic review tool, if applicable. No additional management support is needed unless otherwise documented below in the visit note.  Chief Complaint  Patient presents with  . Sinus Pressure    Started early November.  . Nasal Congestion  . Headache  . Chills    HPI: Erica Mckenzie 25 y.o. sda  Comes in today after the onset of upper respiratory congestion like a cold on the first or second of November. She didn't have a fever at that time some nasal congestion and cough clear phlegm. Had been done to improve but over the last day or so felt worse not sure if it was a new illness or the old. Now has a headache I frontal some nausea and stomachache with it. No vomiting or diarrhea chills but no fever. Still has nasal congestion but mostly clear discharge. No swollen glands no history of mono. No major sore throat.  ROS: See pertinent positives and negatives per HPI.  Past Medical History:  Diagnosis Date  . 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 Functional murmur     Family History  Problem Relation Age of Onset  . Healthy Mother     Social History   Social History  . Marital status: Single    Spouse name: N/A  . Number of children: N/A  . Years of education: N/A   Social History Main Topics  . Smoking status: Never Smoker  . Smokeless tobacco: None  . Alcohol use None  . Drug use: Unknown  . Sexual activity: Not Asked   Other Topics Concern  . 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 JUNEL FE 1.5/30 1.5-30 MG-MCG tablet TAKE 1 TABLET BY MOUTH DAILY. 84 tablet 1   No facility-administered medications prior to visit.      EXAM:  BP 130/86 (BP Location: Right Arm, Patient Position: Sitting, Cuff  Size: Normal)   Temp 98.1 F (36.7 C) (Oral)   Wt 153 lb 6.4 oz (69.6 kg)   BMI 24.76 kg/m   Body mass index is 24.76 kg/m.  GENERAL: vitals reviewed and listed above, alert, oriented, appears well hydrated and in no acute distressShe has some mild congestion but is able to breathe through her nose. Nontoxic. HEENT: atraumatic, conjunctiva  clear, no obvious abnormalities on inspection of external nose and ears TMs normal landmarks nares mucoid discharge face tender bifrontal area OP : no lesion edema or exudate  NECK: no obvious masses on inspection palpation shoddy before meals nodes negative PC nodes LUNGS: clear to auscultation bilaterally, no wheezes, rales or rhonchi, good air movement CV: HRRR, no clubbing cyanosis or  peripheral edema nl cap refill  MS: moves all extremities without noticeable focal  abnormality PSYCH: pleasant and cooperative, no obvious depression or anxiety  ASSESSMENT AND PLAN:  Discussed the following assessment and plan:  Protracted URI - 3-4 weeks  some relapsing sx see text   Acute sinusitis with symptoms greater than 10 days - probabl diagnosis Worsening new symptoms could be a second illness versus early sinusitis. Exam is reassuring. Explanation for congestion Tylenol versus Advil if not  improving add antibiotic as we discussed sign up for my chart keep us posted if more advice needed or follow-up. Expectant management discussed. -Patient advised to return or notify health care team  if symptoms worsen ,persist or new concerns arise.  Patient Instructions   symptoms sound like a relapsing sinusitis. Most sinus infections get better on their own with time however if relapsing or lasting long time adding an antibiotic can be helpful.  For now add nasal saline a decongestant such as Sudafed or Mucinex D in the day, and nasal cortisone such as Flonase or Nasacort 2 sprays each side of the nose once a day. If not improving in the next 2-3 days add the  antibiotic treatment. If getting worse or concerned fevers contact us for advice. Your ears are not infected in your chest exam is clear today.    Sinusitis, Adult Sinusitis is soreness and inflammation of your sinuses. Sinuses are hollow spaces in the bones around your face. Your sinuses are located:  Around your eyes.  In the middle of your forehead.  Behind your nose.  In your cheekbones. Your sinuses and nasal passages are lined with a stringy fluid (mucus). Mucus normally drains out of your sinuses. When your nasal tissues become inflamed or swollen, the mucus can become trapped or blocked so air cannot flow through your sinuses. This allows bacteria, viruses, and funguses to grow, which leads to infection. Sinusitis can develop quickly and last for 7?10 days (acute) or for more than 12 weeks (chronic). Sinusitis often develops after a cold. What are the causes? This condition is caused by anything that creates swelling in the sinuses or stops mucus from draining, including:  Allergies.  Asthma.  Bacterial or viral infection.  Abnormally shaped bones between the nasal passages.  Nasal growths that contain mucus (nasal polyps).  Narrow sinus openings.  Pollutants, such as chemicals or irritants in the air.  A foreign object stuck in the nose.  A fungal infection. This is rare. What increases the risk? The following factors may make you more likely to develop this condition:  Having allergies or asthma.  Having had a recent cold or respiratory tract infection.  Having structural deformities or blockages in your nose or sinuses.  Having a weak immune system.  Doing a lot of swimming or diving.  Overusing nasal sprays.  Smoking. What are the signs or symptoms? The main symptoms of this condition are pain and a feeling of pressure around the affected sinuses. Other symptoms include:  Upper toothache.  Earache.  Headache.  Bad breath.  Decreased sense  of smell and taste.  A cough that may get worse at night.  Fatigue.  Fever.  Thick drainage from your nose. The drainage is often green and it may contain pus (purulent).  Stuffy nose or congestion.  Postnasal drip. This is when extra mucus collects in the throat or back of the nose.  Swelling and warmth over the affected sinuses.  Sore throat.  Sensitivity to light. How is this diagnosed? This condition is diagnosed based on symptoms, a medical history, and a physical exam. To find out if your condition is acute or chronic, your health care provider may:  Look in your nose for signs of nasal polyps.  Tap over the affected sinus to check for signs of infection.  View the inside of your sinuses using an imaging device that has a light attached (endoscope). If your health care provider suspects that you have chronic sinusitis, you  may also:  Be tested for allergies.  Have a sample of mucus taken from your nose (nasal culture) and checked for bacteria.  Have a mucus sample examined to see if your sinusitis is related to an allergy. If your sinusitis does not respond to treatment and it lasts longer than 8 weeks, you may have an MRI or CT scan to check your sinuses. These scans also help to determine how severe your infection is. In rare cases, a bone biopsy may be done to rule out more serious types of fungal sinus disease. How is this treated? Treatment for sinusitis depends on the cause and whether your condition is chronic or acute. If a virus is causing your sinusitis, your symptoms will go away on their own within 10 days. You may be given medicines to relieve your symptoms, including:  Topical nasal decongestants. They shrink swollen nasal passages and let mucus drain from your sinuses.  Antihistamines. These drugs block inflammation that is triggered by allergies. This can help to ease swelling in your nose and sinuses.  Topical nasal corticosteroids. These are nasal  sprays that ease inflammation and swelling in your nose and sinuses.  Nasal saline washes. These rinses can help to get rid of thick mucus in your nose. If your condition is caused by bacteria, you will be given an antibiotic medicine. If your condition is caused by a fungus, you will be given an antifungal medicine. Surgery may be needed to correct underlying conditions, such as narrow nasal passages. Surgery may also be needed to remove polyps. Follow these instructions at home: Medicines  Take, use, or apply over-the-counter and prescription medicines only as told by your health care provider. These may include nasal sprays.  If you were prescribed an antibiotic medicine, take it as told by your health care provider. Do not stop taking the antibiotic even if you start to feel better. Hydrate and Humidify  Drink enough water to keep your urine clear or pale yellow. Staying hydrated will help to thin your mucus.  Use a cool mist humidifier to keep the humidity level in your home above 50%.  Inhale steam for 10-15 minutes, 3-4 times a day or as told by your health care provider. You can do this in the bathroom while a hot shower is running.  Limit your exposure to cool or dry air. Rest  Rest as much as possible.  Sleep with your head raised (elevated).  Make sure to get enough sleep each night. General instructions  Apply a warm, moist washcloth to your face 3-4 times a day or as told by your health care provider. This will help with discomfort.  Wash your hands often with soap and water to reduce your exposure to viruses and other germs. If soap and water are not available, use hand sanitizer.  Do not smoke. Avoid being around people who are smoking (secondhand smoke).  Keep all follow-up visits as told by your health care provider. This is important. Contact a health care provider if:  You have a fever.  Your symptoms get worse.  Your symptoms do not improve within 10  days. Get help right away if:  You have a severe headache.  You have persistent vomiting.  You have pain or swelling around your face or eyes.  You have vision problems.  You develop confusion.  Your neck is stiff.  You have trouble breathing. This information is not intended to replace advice given to you by your health care provider. Make  sure you discuss any questions you have with your health care provider. Document Released: 07/15/2005 Document Revised: 03/10/2016 Document Reviewed: 05/10/2015 Elsevier Interactive Patient Education  2017 [employer].     Sheldahl. Sereniti Wan M.D.

## 2016-06-24 NOTE — Patient Instructions (Signed)
symptoms sound like a relapsing sinusitis. Most sinus infections get better on their own with time however if relapsing or lasting long time adding an antibiotic can be helpful.  For now add nasal saline a decongestant such as Sudafed or Mucinex D in the day, and nasal cortisone such as Flonase or Nasacort 2 sprays each side of the nose once a day. If not improving in the next 2-3 days add the antibiotic treatment. If getting worse or concerned fevers contact us for advice. Your ears are not infected in your chest exam is clear today.    Sinusitis, Adult Sinusitis is soreness and inflammation of your sinuses. Sinuses are hollow spaces in the bones around your face. Your sinuses are located:  Around your eyes.  In the middle of your forehead.  Behind your nose.  In your cheekbones. Your sinuses and nasal passages are lined with a stringy fluid (mucus). Mucus normally drains out of your sinuses. When your nasal tissues become inflamed or swollen, the mucus can become trapped or blocked so air cannot flow through your sinuses. This allows bacteria, viruses, and funguses to grow, which leads to infection. Sinusitis can develop quickly and last for 7?10 days (acute) or for more than 12 weeks (chronic). Sinusitis often develops after a cold. What are the causes? This condition is caused by anything that creates swelling in the sinuses or stops mucus from draining, including:  Allergies.  Asthma.  Bacterial or viral infection.  Abnormally shaped bones between the nasal passages.  Nasal growths that contain mucus (nasal polyps).  Narrow sinus openings.  Pollutants, such as chemicals or irritants in the air.  A foreign object stuck in the nose.  A fungal infection. This is rare. What increases the risk? The following factors may make you more likely to develop this condition:  Having allergies or asthma.  Having had a recent cold or respiratory tract infection.  Having  structural deformities or blockages in your nose or sinuses.  Having a weak immune system.  Doing a lot of swimming or diving.  Overusing nasal sprays.  Smoking. What are the signs or symptoms? The main symptoms of this condition are pain and a feeling of pressure around the affected sinuses. Other symptoms include:  Upper toothache.  Earache.  Headache.  Bad breath.  Decreased sense of smell and taste.  A cough that may get worse at night.  Fatigue.  Fever.  Thick drainage from your nose. The drainage is often green and it may contain pus (purulent).  Stuffy nose or congestion.  Postnasal drip. This is when extra mucus collects in the throat or back of the nose.  Swelling and warmth over the affected sinuses.  Sore throat.  Sensitivity to light. How is this diagnosed? This condition is diagnosed based on symptoms, a medical history, and a physical exam. To find out if your condition is acute or chronic, your health care provider may:  Look in your nose for signs of nasal polyps.  Tap over the affected sinus to check for signs of infection.  View the inside of your sinuses using an imaging device that has a light attached (endoscope). If your health care provider suspects that you have chronic sinusitis, you may also:  Be tested for allergies.  Have a sample of mucus taken from your nose (nasal culture) and checked for bacteria.  Have a mucus sample examined to see if your sinusitis is related to an allergy. If your sinusitis does not respond to treatment  and it lasts longer than 8 weeks, you may have an MRI or CT scan to check your sinuses. These scans also help to determine how severe your infection is. In rare cases, a bone biopsy may be done to rule out more serious types of fungal sinus disease. How is this treated? Treatment for sinusitis depends on the cause and whether your condition is chronic or acute. If a virus is causing your sinusitis, your  symptoms will go away on their own within 10 days. You may be given medicines to relieve your symptoms, including:  Topical nasal decongestants. They shrink swollen nasal passages and let mucus drain from your sinuses.  Antihistamines. These drugs block inflammation that is triggered by allergies. This can help to ease swelling in your nose and sinuses.  Topical nasal corticosteroids. These are nasal sprays that ease inflammation and swelling in your nose and sinuses.  Nasal saline washes. These rinses can help to get rid of thick mucus in your nose. If your condition is caused by bacteria, you will be given an antibiotic medicine. If your condition is caused by a fungus, you will be given an antifungal medicine. Surgery may be needed to correct underlying conditions, such as narrow nasal passages. Surgery may also be needed to remove polyps. Follow these instructions at home: Medicines  Take, use, or apply over-the-counter and prescription medicines only as told by your health care provider. These may include nasal sprays.  If you were prescribed an antibiotic medicine, take it as told by your health care provider. Do not stop taking the antibiotic even if you start to feel better. Hydrate and Humidify  Drink enough water to keep your urine clear or pale yellow. Staying hydrated will help to thin your mucus.  Use a cool mist humidifier to keep the humidity level in your home above 50%.  Inhale steam for 10-15 minutes, 3-4 times a day or as told by your health care provider. You can do this in the bathroom while a hot shower is running.  Limit your exposure to cool or dry air. Rest  Rest as much as possible.  Sleep with your head raised (elevated).  Make sure to get enough sleep each night. General instructions  Apply a warm, moist washcloth to your face 3-4 times a day or as told by your health care provider. This will help with discomfort.  Wash your hands often with soap and  water to reduce your exposure to viruses and other germs. If soap and water are not available, use hand sanitizer.  Do not smoke. Avoid being around people who are smoking (secondhand smoke).  Keep all follow-up visits as told by your health care provider. This is important. Contact a health care provider if:  You have a fever.  Your symptoms get worse.  Your symptoms do not improve within 10 days. Get help right away if:  You have a severe headache.  You have persistent vomiting.  You have pain or swelling around your face or eyes.  You have vision problems.  You develop confusion.  Your neck is stiff.  You have trouble breathing. This information is not intended to replace advice given to you by your health care provider. Make sure you discuss any questions you have with your health care provider. Document Released: 07/15/2005 Document Revised: 03/10/2016 Document Reviewed: 05/10/2015 Elsevier Interactive Patient Education  2017 [employer]

## 2017-04-02 NOTE — Progress Notes (Signed)
[facility] [address] [place], [place] [address] Phone: [phone] Subjective:    I'm seeing this patient by the request  of:    CC: knee pain   BJY:NWGNFAOZHY  Erica Mckenzie is a 26 y.o. female coming in with complaint of Right-sided knee pain. Been going on for months. Seems to be worsening. Worse with going up or downstairs. Seems to be on the lateral aspect of the knee. Rates the severity pain is 8 out of 10. States that it comes and goes up. Still working on a regular basis.    Past Medical History:  Diagnosis Date  . 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 Functional murmur    No past surgical history on file. Social History   Social History  . Marital status: Single    Spouse name: N/A  . Number of children: N/A  . Years of education: N/A   Social History Main Topics  . Smoking status: Never Smoker  . Smokeless tobacco: Not on file  . Alcohol use Not on file  . Drug use: Unknown  . Sexual activity: Not on file   Other Topics Concern  . 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 Healthy Mother      Past medical history, social, surgical and family history all reviewed in electronic medical record.  No pertanent information unless stated regarding to the chief complaint.   Review of Systems:Review of systems updated and as accurate as of 04/02/17  No headache, visual changes, nausea, vomiting, diarrhea, constipation, dizziness, abdominal pain, skin rash, fevers, chills, night sweats, weight loss, swollen lymph nodes, body aches, joint swelling, muscle aches, chest pain, shortness of breath, mood changes.   Objective  There were no vitals taken for this visit. Systems examined below as of  04/02/17   General: No apparent distress alert and oriented x3 mood and affect normal, dressed appropriately.  HEENT: Pupils equal, extraocular movements intact  Respiratory: Patient's speak in full sentences and does not appear short of breath  Cardiovascular: No lower extremity edema, non tender, no erythema  Skin: Warm dry intact with no signs of infection or rash on extremities or on axial skeleton.  Abdomen: Soft nontender  Neuro: Cranial nerves II through XII are intact, neurovascularly intact in all extremities with 2+ DTRs and 2+ pulses.  Lymph: No lymphadenopathy of posterior or anterior cervical chain or axillae bilaterally.  Gait normal with good balance and coordination.  MSK:  Non tender with full range of motion and good stability and symmetric strength and tone of shoulders, elbows, wrist, hip, and ankles bilaterally.  Knee: Right Mild lateral tilt noted Mild tenderness over the patellofemoral joint. ROM full in flexion and extension and lower leg rotation. Ligaments with solid consistent endpoints including ACL, PCL, LCL, MCL. Negative Mcmurray's, Apley's, and Thessalonian tests. Mild painful patellar compression. Patellar glide with moderate crepitus. Patellar and quadriceps tendons unremarkable. Hamstring and quadriceps strength is n ormal.  Contralateral knee unremarkable  MSK US performed of: Right knee This study was ordered, performed, and interpreted by Terrilee Files D.O.  Knee: All  structures visualized. Patient does have a lateral tilt noted patient does have some hypoechoic changes in this area. Patellar Tendon unremarkable on long and transverse views without effusion. No abnormality of prepatellar bursa. LCL and MCL unremarkable on long and transverse views. No abnormality of origin of medial or lateral head of the gastrocnemius.  IMPRESSION: Mild patellofemoral syndrome  [id number]; 15 additional minutes spent for Therapeutic exercises as stated in above  notes.  This included exercises focusing on stretching, strengthening, with significant focus on eccentric aspects.   Long term goals include an improvement in range of motion, strength, endurance as well as avoiding reinjury. Patient's frequency would include in 1-2 times a day, 3-5 times a week for a duration of 6-12 weeks. Patellofemoral Syndrome  Reviewed anatomy using anatomical model and how PFS occurs.  Given rehab exercises handout for VMO, hip abductors, core, entire kinetic chain including proprioception exercises including cone touches, step downs, hip elevations and turn outs.  Could benefit from PT, regular exercise, upright biking, and a PFS knee brace to assist with tracking abnormalities.  Proper technique shown and discussed handout in great detail with ATC.  All questions were discussed and answered.      Impression and Recommendations:     This case required medical decision making of moderate complexity.      Note: This dictation was prepared with Dragon dictation along with smaller phrase technology. Any transcriptional errors that result from this process are unintentional.

## 2017-04-03 ENCOUNTER — Ambulatory Visit: Payer: Self-pay

## 2017-04-03 ENCOUNTER — Encounter: Payer: Self-pay | Admitting: Family Medicine

## 2017-04-03 ENCOUNTER — Ambulatory Visit (INDEPENDENT_AMBULATORY_CARE_PROVIDER_SITE_OTHER): Payer: BC Managed Care – PPO | Admitting: Family Medicine

## 2017-04-03 VITALS — BP 114/80 | HR 84 | Ht 66.0 in | Wt 152.0 lb

## 2017-04-03 DIAGNOSIS — M222X1 Patellofemoral disorders, right knee: Secondary | ICD-10-CM

## 2017-04-03 DIAGNOSIS — M25561 Pain in right knee: Secondary | ICD-10-CM | POA: Diagnosis not present

## 2017-04-03 MED ORDER — DICLOFENAC SODIUM 2 % TD SOLN: 2.0000 "application " | Freq: Two times a day (BID) | TRANSDERMAL | 3 refills | Status: DC

## 2017-04-03 NOTE — Patient Instructions (Signed)
Good to see you.  Ice 20 minutes 2 times daily. Usually after activity and before bed. Exercises 3 times a week.  pennsaid pinkie amount topically 2 times daily as needed.  Biking instead for cardio Over the counter get Vitamin D 2000 IU daily  Newtons, Brooks, or shoes for stability could be helpful See me again in 4 weeks!

## 2017-04-03 NOTE — Assessment & Plan Note (Signed)
Patient is a mild patellofemoral syndrome. Nothing seems to be internally wrong with the knee. Topical anti-inflammatories prescribed. Discussed icing regimen. Discussed home exercises. Patient will not do any type of bracing at the moment. Work with Event organiserathletic trainer to learn home exercises in greater detail. Follow-up with me again in 4 weeks.

## 2017-05-01 ENCOUNTER — Ambulatory Visit (INDEPENDENT_AMBULATORY_CARE_PROVIDER_SITE_OTHER): Payer: BC Managed Care – PPO | Admitting: Family Medicine

## 2017-05-01 ENCOUNTER — Encounter: Payer: Self-pay | Admitting: Family Medicine

## 2017-05-01 DIAGNOSIS — M222X1 Patellofemoral disorders, right knee: Secondary | ICD-10-CM | POA: Diagnosis not present

## 2017-05-01 NOTE — Progress Notes (Signed)
[facility] [address] [place], [place] [address] Phone: [phone] Subjective:    I'm seeing this patient by the request  of:    CC: Right knee pain follow  BJY:NWGNFAOZHY  Erica Mckenzie is a 26 y.o. female coming in with complaint of toe now more of a greater patellofemoral on the right side. Doing relatively well. States 80% better. Has changed which she is doing for cardio and feels that that has been more of an improvement. Has not been doing home exercises. Only occasionally using the topical anti-inflammatories. Did not get the vitamins at this time.    Past Medical History:  Diagnosis Date  . 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 Functional murmur    No past surgical history on file. Social History   Social History  . Marital status: Single    Spouse name: N/A  . Number of children: N/A  . Years of education: N/A   Social History Main Topics  . Smoking status: Never Smoker  . Smokeless tobacco: Never Used  . Alcohol use Not on file  . Drug use: Unknown  . Sexual activity: Not on file   Other Topics Concern  . 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 Healthy Mother      Past medical history, social, surgical and family history all reviewed in electronic medical record.  No pertanent information unless stated regarding to the chief complaint.   Review of Systems:Review of systems updated and as accurate as of 05/01/17  No headache, visual changes, nausea, vomiting, diarrhea, constipation, dizziness, abdominal pain, skin rash, fevers, chills, night sweats, weight loss, swollen lymph nodes, body aches, joint swelling, muscle aches, chest pain, shortness of breath, mood changes.    Objective  There were no vitals taken for this visit. Systems examined below as of 05/01/17   General: No apparent distress alert and oriented x3 mood and affect normal, dressed appropriately.  HEENT: Pupils equal, extraocular movements intact  Respiratory: Patient's speak in full sentences and does not appear short of breath  Cardiovascular: No lower extremity edema, non tender, no erythema  Skin: Warm dry intact with no signs of infection or rash on extremities or on axial skeleton.  Abdomen: Soft nontender  Neuro: Cranial nerves II through XII are intact, neurovascularly intact in all extremities with 2+ DTRs and 2+ pulses.  Lymph: No lymphadenopathy of posterior or anterior cervical chain or axillae bilaterally.  Gait normal with good balance and coordination.  MSK:  Non tender with full range of motion and good stability and symmetric strength and tone of shoulders, elbows, wrist, hip, and ankles bilaterally.  Knee: Right Mild lateral tilt of the patella Palpation normal with no warmth, joint line tenderness, patellar tenderness, or condyle tenderness. ROM full in flexion and extension and lower leg rotation. Ligaments with solid consistent endpoints including ACL, PCL, LCL, MCL. Negative Mcmurray's, Apley's, and Thessalonian tests. Mild painful patellar compression. Patellar glide with mild crepitus. Patellar and quadriceps tendons unremarkable. Hamstring and quadriceps strength is normal.  Contralateral knee unremarkable   Impression and Recommendations:  This case required medical decision making of moderate complexity.      Note: This dictation was prepared with Dragon dictation along with smaller phrase technology. Any transcriptional errors that result from this process are unintentional.

## 2017-05-01 NOTE — Assessment & Plan Note (Signed)
Doing significantly better at this time. Discussed icing regimen. Discussed bracing needed. Topical anti-inflammatories encourage. Has not resolved in 2 months for further follow-up as needed

## 2017-06-04 ENCOUNTER — Telehealth: Payer: Self-pay | Admitting: Family Medicine

## 2017-06-04 NOTE — Telephone Encounter (Signed)
Patient called to cancel her appointment that was scheduled for next week. She said that she has not had any issues with biking since her last appointment and wanted to let Dr Katrinka BlazingSmith know.

## 2017-06-05 NOTE — Telephone Encounter (Signed)
Noted  

## 2017-06-12 ENCOUNTER — Ambulatory Visit: Payer: BC Managed Care – PPO | Admitting: Family Medicine

## 2017-11-17 ENCOUNTER — Ambulatory Visit: Payer: BC Managed Care – PPO | Admitting: Family Medicine

## 2017-12-01 ENCOUNTER — Ambulatory Visit (INDEPENDENT_AMBULATORY_CARE_PROVIDER_SITE_OTHER)
Admission: RE | Admit: 2017-12-01 | Discharge: 2017-12-01 | Disposition: A | Discharge status: Home / Self Care | Payer: Managed Care, Other (non HMO) | Source: Ambulatory Visit | Attending: Family Medicine | Admitting: Family Medicine

## 2017-12-01 ENCOUNTER — Ambulatory Visit: Payer: Self-pay

## 2017-12-01 ENCOUNTER — Ambulatory Visit (INDEPENDENT_AMBULATORY_CARE_PROVIDER_SITE_OTHER): Payer: Managed Care, Other (non HMO) | Admitting: Family Medicine

## 2017-12-01 ENCOUNTER — Telehealth: Payer: Self-pay | Admitting: Family Medicine

## 2017-12-01 ENCOUNTER — Encounter: Payer: Self-pay | Admitting: Family Medicine

## 2017-12-01 VITALS — BP 106/58 | HR 76 | Ht 66.0 in | Wt 160.0 lb

## 2017-12-01 DIAGNOSIS — M79641 Pain in right hand: Secondary | ICD-10-CM | POA: Insufficient documentation

## 2017-12-01 NOTE — Assessment & Plan Note (Signed)
Doesn't appear to be fracture on [place]. Increased vascularity around the capsule  - buddy tape  - xray  - will try taping for 3 weeks. If no improvement consider trial PT vs anti-inflammatory

## 2017-12-01 NOTE — Telephone Encounter (Signed)
Left VM for patient. If she calls back please have her speak with a nurse/CMA and inform that xray didn't show a fracture. We could try an anti-inflammatory to see if that improves her pain. The [facility] can report results to patient.   If any questions then please take the best time and phone number to call and I will try to call her back.   Myra Rude, MD [facility] 12/01/2017, 5:55 PM

## 2017-12-01 NOTE — Telephone Encounter (Signed)
No [facility] RN available to go over x-ray results. Please call patient back.

## 2017-12-01 NOTE — Patient Instructions (Signed)
Nice to meet you! I will call you with the results from today  Please try to ice the knuckle  Try to keep your fingers wrapped for most of the day  Please follow up with me in 3 weeks.

## 2017-12-01 NOTE — Progress Notes (Signed)
Erica Mckenzie - 27 y.o. female MRN [id number]  Date of birth: 07-Apr-1991  SUBJECTIVE:  Including CC & ROS.  Chief Complaint  Patient presents with  . Hand Pain    Erica Mckenzie is a 27 y.o. female that is presenting with finger pain. Pain has been present for one month. She hit her hand while she was running on the treadmill. Pain is located at the 3rd MCP joint. Having some swelling here.  Pain is mild upon flexion.  Has pain if she is trying to grip with her right hand. Pain has been ongoing. Localized to around the joint.     Review of Systems  Constitutional: Negative for fever.  HENT: Negative for congestion.   Respiratory: Negative for cough.   Cardiovascular: Negative for chest pain.  Gastrointestinal: Negative for abdominal pain.  Musculoskeletal: Positive for joint swelling.  Skin: Positive for color change.  Neurological: Negative for weakness.  Hematological: Negative for adenopathy.  Psychiatric/Behavioral: Negative for agitation.    HISTORY: Past Medical, Surgical, Social, and Family History Reviewed & Updated per EMR.   Pertinent Historical Findings include:  Past Medical History:  Diagnosis Date  . 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 Functional murmur     No past surgical history on file.  No Known Allergies  Family History  Problem Relation Age of Onset  . Healthy Mother      Social History   Socioeconomic History  . Marital status: Single    Spouse name: Not on file  . Number of children: Not on file  . Years of education: Not on file  . Highest education level: Not on file  Occupational History  . Not on file  Social Needs  . Financial resource strain: Not on file  . Food insecurity:    Worry: Not on file    Inability: Not on file  . Transportation needs:    Medical: Not on file    Non-medical: Not on file  Tobacco Use  . Smoking status: Never Smoker  . Smokeless tobacco: Never Used  Substance and Sexual Activity  . Alcohol use:  Not on file  . Drug use: Not on file  . Sexual activity: Not on file  Lifestyle  . Physical activity:    Days per week: Not on file    Minutes per session: Not on file  . Stress: Not on file  Relationships  . Social connections:    Talks on phone: Not on file    Gets together: Not on file    Attends religious service: Not on file    Active member of club or organization: Not on file    Attends meetings of clubs or organizations: Not on file    Relationship status: Not on file  . Intimate partner violence:    Fear of current or ex partner: Not on file    Emotionally abused: Not on file    Physically abused: Not on file    Forced sexual activity: Not on file  Other Topics Concern  . 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    Works [employer] 40 hours per week      PHYSICAL EXAM:  VS: BP (!) 106/58 (BP Location: Left Arm, Patient Position: Sitting)  Pulse 76   Ht  (1.676 m)   Wt 160 lb (72.6 kg)   LMP 11/10/2017   SpO2 98%   BMI 25.82 kg/m  Physical Exam Gen: NAD, alert, cooperative with exam, well-appearing ENT: normal lips, normal nasal mucosa,  Eye: normal EOM, normal conjunctiva and lids CV:  no edema, +2 pedal pulses   Resp: no accessory muscle use, non-labored,   Skin: no rashes, no areas of induration  Neuro: normal tone, normal sensation to touch Psych:  normal insight, alert and oriented MSK:  Right hand:  Swelling and redness of the 3rd MCP joint  Limited flexion of 3rd MCP joint  TTP of the 3rd MCP joint  Normal finger adduction and abduction  Neurovascularly intact   Limited ultrasound: right hand:  No fracture of the 3rd MCP  No joint effusion of the 3rd MCP joint  Radial side of 3rd MCP joint shows increased vascularity   Summary: possible for capsule inflammation or other inflammatory change   Ultrasound  and interpretation by Clare Gandy, MD    ASSESSMENT & PLAN:   Right hand pain Doesn't appear to be fracture on [place]. Increased vascularity around the capsule  - buddy tape  - xray  - will try taping for 3 weeks. If no improvement consider trial PT vs anti-inflammatory

## 2017-12-02 NOTE — Telephone Encounter (Signed)
Called pt to give her xray results, no answer. Left message on voice mail to call us back.

## 2017-12-09 ENCOUNTER — Ambulatory Visit: Payer: Self-pay | Admitting: Family Medicine

## 2017-12-10 ENCOUNTER — Ambulatory Visit: Payer: Managed Care, Other (non HMO) | Admitting: Family Medicine

## 2017-12-18 NOTE — Progress Notes (Signed)
[facility] [address] [place], [place] [address] Phone: [phone] Subjective:     CC: Right hand pain  [id number]  Erica Mckenzie is a 27 y.o. female coming in with complaint of right hand between the 2nd and 3rd finger. She hit her hand on a treadmill. She is not able to make a fist. Pain is intermittent and dull.  Patient denies any pain with no movement.  Patient will states if she grips something she can have a severe amount of pain.  States that the swelling from the initial injury has improved a little bit but continues to be there does not have full range of motion of the hand    Past Medical History:  Diagnosis Date  . 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 Functional murmur    No past surgical history on file. Social History   Socioeconomic History  . Marital status: Single    Spouse name: Not on file  . Number of children: Not on file  . Years of education: Not on file  . Highest education level: Not on file  Occupational History  . Not on file  Social Needs  . Financial resource strain: Not on file  . Food insecurity:    Worry: Not on file    Inability: Not on file  . Transportation needs:    Medical: Not on file    Non-medical: Not on file  Tobacco Use  . Smoking status: Never Smoker  . Smokeless tobacco: Never Used  Substance and Sexual Activity  . Alcohol use: Not on file  . Drug use: Not on file  . Sexual activity: Not on file  Lifestyle  . Physical activity:    Days per week: Not on file    Minutes per session: Not on file  . Stress: Not on file  Relationships  . Social connections:    Talks on phone: Not on file    Gets together: Not on file    Attends religious service: Not on file    Active member of club or organization: Not on file    Attends meetings of clubs or organizations: Not on file    Relationship status: Not on file  Other Topics Concern  . 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 Healthy Mother      Past medical history, social, surgical and family history all reviewed in electronic medical record.  No pertanent information unless stated regarding to the chief complaint.   Review of Systems:Review of systems updated and as accurate as of 12/19/17  No headache, visual changes, nausea, vomiting, diarrhea, constipation, dizziness, abdominal pain, skin rash, fevers, chills, night sweats, weight loss, swollen lymph nodes, body aches, muscle aches, chest pain, shortness of breath, mood changes.  Mild joint swelling of the hand  Objective  Blood pressure 120/80, pulse 65, height  (1.676 m), weight 161 lb (73 kg), SpO2 98 %. Systems examined below as of 12/19/17   General: No apparent distress alert and oriented x3 mood and affect normal, dressed appropriately.  HEENT: Pupils equal, extraocular movements intact  Respiratory: Patient's speak in full sentences and does not appear short of breath  Cardiovascular: No lower extremity edema, non tender, no erythema  Skin: Warm dry intact with no signs of infection or rash on extremities or on axial skeleton.  Abdomen: Soft nontender  Neuro: Cranial nerves II through XII are intact, neurovascularly intact in all extremities with 2+ DTRs and 2+ pulses.  Lymph: No lymphadenopathy of posterior or anterior cervical chain or axillae bilaterally.  Gait normal with good balance and coordination.  MSK:  Non tender with full range of motion and good stability and symmetric strength and tone of shoulders, elbows, wrist, hip, knee and ankles bilaterally.  Right hand exam shows the patient does have swelling over the dorsal aspect of the second metacarpal joint.  It is between the first and second as well.   Patient can almost close a fist.  No angulation noted.  Patient is tender to palpation.  Limited musculoskeletal ultrasound was performed and interpreted by Judi Saa  Limited ultrasound of the third metacarpal shows no significant displacement the patient does have what appears to be a little small nondisplaced fracture.  No significant callus formation noted.  Small hematoma between the first and second fingers.  Increased neovascularization     Impression and Recommendations:     This case required medical decision making of moderate complexity.      Note: This dictation was prepared with Dragon dictation along with smaller phrase technology. Any transcriptional errors that result from this process are unintentional.

## 2017-12-19 ENCOUNTER — Encounter

## 2017-12-19 ENCOUNTER — Ambulatory Visit (INDEPENDENT_AMBULATORY_CARE_PROVIDER_SITE_OTHER): Payer: Managed Care, Other (non HMO) | Admitting: Family Medicine

## 2017-12-19 ENCOUNTER — Ambulatory Visit: Payer: Self-pay

## 2017-12-19 ENCOUNTER — Ambulatory Visit: Payer: Managed Care, Other (non HMO) | Admitting: Family Medicine

## 2017-12-19 VITALS — BP 120/80 | HR 65 | Ht 66.0 in | Wt 161.0 lb

## 2017-12-19 DIAGNOSIS — S62340A Nondisplaced fracture of base of second metacarpal bone, right hand, initial encounter for closed fracture: Secondary | ICD-10-CM

## 2017-12-19 DIAGNOSIS — M79641 Pain in right hand: Secondary | ICD-10-CM | POA: Diagnosis not present

## 2017-12-19 MED ORDER — VITAMIN D (ERGOCALCIFEROL) 1.25 MG (50000 UNIT) PO CAPS
50000.0000 [IU] | ORAL_CAPSULE | ORAL | 0 refills | Status: DC
Start: 2017-12-19 — End: 2018-03-09

## 2017-12-19 NOTE — Assessment & Plan Note (Signed)
Small nondisplaced fracture noted.  Patient does have some vascularity that is different at this moment is likely neovascularization.  Small hematoma noted.  Discussed icing regimen, bracing at night, once weekly vitamin D.  Warned of potential side effects.  Patient is going to continue to be monitored.  Follow-up again in 3 weeks

## 2017-12-19 NOTE — Patient Instructions (Signed)
Good to see you  You have a small fracture Once weekly vitamin D for at least 4 week but likely 12 weeks to help healing pennsaid pinkie amount topically 2 times daily as needed.  Ice 20 minutes 2 times daily. Usually after activity and before bed. Try bracing at night if you can tolerate it.  Arnica lotion over the counter 2 times a day could help  See em again in 3 weeks

## 2018-01-12 NOTE — Progress Notes (Signed)
[facility] [address] [place], [place] [address] Phone: [phone] Subjective:     CC: Hand pain follow-up  BJY:[id number]:Subjective  Marvia PicklesJamie Mckenzie is a 27 y.o. female coming in with complaint of hand pain.  The second metatarsal fracture.  Patient states doing 75% better.  Still has some limited range of motion.  Noticing some swelling.  No numbness or tingling.  Able to do all activities of daily living with some mild soreness.     Past Medical History:  Diagnosis Date  . 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 Functional murmur    No past surgical history on file. Social History   Socioeconomic History  . Marital status: Single    Spouse name: Not on file  . Number of children: Not on file  . Years of education: Not on file  . Highest education level: Not on file  Occupational History  . Not on file  Social Needs  . Financial resource strain: Not on file  . Food insecurity:    Worry: Not on file    Inability: Not on file  . Transportation needs:    Medical: Not on file    Non-medical: Not on file  Tobacco Use  . Smoking status: Never Smoker  . Smokeless tobacco: Never Used  Substance and Sexual Activity  . Alcohol use: Not on file  . Drug use: Not on file  . Sexual activity: Not on file  Lifestyle  . Physical activity:    Days per week: Not on file    Minutes per session: Not on file  . Stress: Not on file  Relationships  . Social connections:    Talks on phone: Not on file    Gets together: Not on file    Attends religious service: Not on file    Active member of club or organization: Not on file    Attends meetings of clubs or organizations: Not on file    Relationship status: Not on file  Other Topics Concern  . 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 Healthy Mother      Past medical history, social, surgical and family history all reviewed in electronic medical record.  No pertanent information unless stated regarding to the chief complaint.   Review of Systems:Review of systems updated and as accurate as of 01/13/18  No headache, visual changes, nausea, vomiting, diarrhea, constipation, dizziness, abdominal pain, skin rash, fevers, chills, night sweats, weight loss, swollen lymph nodes, body aches, joint swelling, muscle aches, chest pain, shortness of breath, mood changes.   Objective  Blood pressure 130/90, pulse 96, height 5\' 6"  (1.676 m), weight 163 lb (73.9 kg), SpO2 98 %. Systems examined below as of 01/13/18   General: No apparent distress alert and oriented x3 mood and affect normal, dressed appropriately.  HEENT: Pupils equal, extraocular movements intact  Respiratory: Patient's speak in full sentences and does not appear short of breath  Cardiovascular: No lower extremity edema, non tender, no erythema  Skin: Warm dry intact with no signs of infection or rash on extremities or on axial  skeleton.  Abdomen: Soft nontender  Neuro: Cranial nerves II through XII are intact, neurovascularly intact in all extremities with 2+ DTRs and 2+ pulses.  Lymph: No lymphadenopathy of posterior or anterior cervical chain or axillae bilaterally.  Gait normal with good balance and coordination.  MSK:  Non tender with full range of motion and good stability and symmetric strength and tone of shoulders, elbows, wrist, hip, knee and ankles bilaterally.  Right hand exam shows some swelling between the first and second metacarpals.  Near full range of motion lacking last 5 degrees of flexion of both fingers.  Patient neurovascularly intact.  Tender to palpation in the inner phalanx base  Limited musculoskeletal ultrasound was performed and interpreted by Judi Saa  Limited  ultrasound the patient does have callus formation noted.  Continued hypoechoic changes and soft tissue swelling noted.  No significant dynamic instability noted of the fingers.  Callus formation noted. Impression: Interval healing of the second metacarpal fracture    Impression and Recommendations:     This case required medical decision making of moderate complexity.      Note: This dictation was prepared with Dragon dictation along with smaller phrase technology. Any transcriptional errors that result from this process are unintentional.

## 2018-01-13 ENCOUNTER — Encounter: Payer: Self-pay | Admitting: Family Medicine

## 2018-01-13 ENCOUNTER — Ambulatory Visit (INDEPENDENT_AMBULATORY_CARE_PROVIDER_SITE_OTHER): Payer: Managed Care, Other (non HMO) | Admitting: Family Medicine

## 2018-01-13 DIAGNOSIS — S62340G Nondisplaced fracture of base of second metacarpal bone, right hand, subsequent encounter for fracture with delayed healing: Secondary | ICD-10-CM | POA: Diagnosis not present

## 2018-01-13 NOTE — Patient Instructions (Signed)
Good to see you  Ice is yoru friend Try to work on range of motion of the finger.  Lets add K2 daily over the counter for 1 month See me again In 1 month

## 2018-01-13 NOTE — Assessment & Plan Note (Signed)
Interval healing noted but there is still posttraumatic inflammation.  Declined any type of aspiration today.  Discussed the range of motion and icing regimen.  Continue the vitamin D we discussed other over-the-counter medications.  Follow-up with me again in 4 weeks

## 2018-02-12 ENCOUNTER — Ambulatory Visit: Payer: Managed Care, Other (non HMO) | Admitting: Family Medicine

## 2018-03-09 ENCOUNTER — Other Ambulatory Visit: Payer: Self-pay | Admitting: Family Medicine

## 2018-05-25 ENCOUNTER — Other Ambulatory Visit: Payer: Self-pay | Admitting: Family Medicine

## 2018-06-10 ENCOUNTER — Ambulatory Visit (INDEPENDENT_AMBULATORY_CARE_PROVIDER_SITE_OTHER): Payer: 59

## 2018-06-10 DIAGNOSIS — Z23 Encounter for immunization: Secondary | ICD-10-CM

## 2018-07-11 ENCOUNTER — Encounter: Payer: Self-pay | Admitting: Physician Assistant

## 2018-07-11 ENCOUNTER — Ambulatory Visit (INDEPENDENT_AMBULATORY_CARE_PROVIDER_SITE_OTHER): Payer: 59 | Admitting: Physician Assistant

## 2018-07-11 ENCOUNTER — Other Ambulatory Visit (HOSPITAL_COMMUNITY)
Admission: RE | Admit: 2018-07-11 | Discharge: 2018-07-11 | Disposition: A | Discharge status: Home / Self Care | Payer: 59 | Source: Ambulatory Visit | Attending: Physician Assistant | Admitting: Physician Assistant

## 2018-07-11 ENCOUNTER — Inpatient Hospital Stay (HOSPITAL_COMMUNITY): Admit: 2018-07-11 | Discharge: 2018-07-11 | Disposition: A | Discharge status: Home / Self Care | Payer: Self-pay

## 2018-07-11 VITALS — BP 112/78 | HR 75 | Temp 98.1°F | Ht 66.0 in | Wt 159.5 lb

## 2018-07-11 DIAGNOSIS — R300 Dysuria: Secondary | ICD-10-CM | POA: Diagnosis not present

## 2018-07-11 DIAGNOSIS — Z113 Encounter for screening for infections with a predominantly sexual mode of transmission: Secondary | ICD-10-CM | POA: Diagnosis present

## 2018-07-11 LAB — POCT URINALYSIS DIPSTICK
Appearance: NEGATIVE
Bilirubin, UA: NEGATIVE
Blood, UA: 10
Glucose, UA: NEGATIVE
Ketones, UA: NEGATIVE
LEUKOCYTES UA: NEGATIVE
Nitrite, UA: NEGATIVE
Protein, UA: POSITIVE — AB
Spec Grav, UA: 1.025 (ref 1.010–1.025)
Urobilinogen, UA: 0.2 E.U./dL
pH, UA: 6 (ref 5.0–8.0)

## 2018-07-11 MED ORDER — CEPHALEXIN 500 MG PO CAPS: 500.0000 mg | ORAL_CAPSULE | Freq: Two times a day (BID) | ORAL | 0 refills | Status: AC

## 2018-07-11 NOTE — Progress Notes (Signed)
Patient presents to clinic today c/o 5 days of urinary urgency, hematuria, suprapubic pressure, urinary hesitancy and LBP. Denies fever, chills, nausea or vomiting. Denies history of UTI or STI. LMP was 2.5 weeks ago. Is sexually active with a female partner. Unsure of STI risk. Is using protection.   Past Medical History:  Diagnosis Date  . 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 Functional murmur     Current Outpatient Medications on File Prior to Visit  Medication Sig Dispense Refill  . JUNEL FE 1.5/30 1.5-30 MG-MCG tablet TAKE 1 TABLET BY MOUTH DAILY. 84 tablet 1   No current facility-administered medications on file prior to visit.     No Known Allergies  Family History  Problem Relation Age of Onset  . Healthy Mother     Social History   Socioeconomic History  . Marital status: Single    Spouse name: Not on file  . Number of children: Not on file  . Years of education: Not on file  . Highest education level: Not on file  Occupational History  . Not on file  Social Needs  . Financial resource strain: Not on file  . Food insecurity:    Worry: Not on file    Inability: Not on file  . Transportation needs:    Medical: Not on file    Non-medical: Not on file  Tobacco Use  . Smoking status: Never Smoker  . Smokeless tobacco: Never Used  Substance and Sexual Activity  . Alcohol use: Not on file  . Drug use: Not on file  . Sexual activity: Not on file  Lifestyle  . Physical activity:    Days per week: Not on file    Minutes per session: Not on file  . Stress: Not on file  Relationships  . Social connections:    Talks on phone: Not on file    Gets together: Not on file    Attends religious service: Not on file    Active member of club or organization: Not on file    Attends meetings of clubs or organizations: Not on file    Relationship status: Not on file  Other Topics Concern  . 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  All other ROS are negative.  BP 112/78 (BP Location: Left Arm, Patient Position: Sitting, Cuff Size: Normal)   Pulse 75   Temp 98.1 F (36.7 C) (Oral)   Ht 5\' 6"  (1.676 m)   Wt 159 lb 8 oz (72.3 kg)   SpO2 99%   BMI 25.74 kg/m   Physical Exam Vitals signs reviewed.  Constitutional:      Appearance: Normal appearance.  HENT:     Head: Normocephalic.  Cardiovascular:     Rate and Rhythm: Normal rate and regular rhythm.     Heart sounds: Normal heart sounds.  Pulmonary:     Effort: Pulmonary effort is normal.     Breath sounds: Normal breath sounds.  Abdominal:     General: There is no distension.     Tenderness: There is no abdominal tenderness. There is no right CVA tenderness or left CVA tenderness.  Neurological:  Mental Status: She is alert.     Assessment/Plan: 1. Dysuria 5 days of classic cystitis symptoms -- hematuria, urgency, frequency, LBP and hesitancy. Urine dip with trace blood. Otherwise unremarkable. Will send for culture. Giving classic symptoms will start empiric treatment with KEflex. - POCT urinalysis dipstick - Urine Culture - cephALEXin (KEFLEX) 500 MG capsule; Take 1 capsule (500 mg total) by mouth 2 (two) times daily for 7 days.  Dispense: 14 capsule; Refill: 0  2. Routine screening for STI (sexually transmitted infection) Ancillary studies added to assess for vaginal infection giving she is sexually active and has had a new partner. Will have her obtain blood STI panel with my regular office or PCP next week as we do not have lab here at [facility]. - Urine cytology ancillary only   Piedad ClimesWilliam Cody Tashiana Lamarca, PA-C

## 2018-07-11 NOTE — Patient Instructions (Signed)
Your symptoms are consistent with a bladder infection, also called acute cystitis. Please take your antibiotic (Keflex) as directed until all pills are gone.  Stay very well hydrated.  Consider a daily probiotic (Align, Culturelle, or Activia) to help prevent stomach upset caused by the antibiotic.  Taking a probiotic daily may also help prevent recurrent UTIs.  Also consider taking AZO (Phenazopyridine) tablets to help decrease pain with urination.  I will call you with your urine testing results.  We will change antibiotics if indicated.  I am also checking for vaginal infections via the urine.   Urinary Tract Infection A urinary tract infection (UTI) can occur any place along the urinary tract. The tract includes the kidneys, ureters, bladder, and urethra. A type of germ called bacteria often causes a UTI. UTIs are often helped with antibiotic medicine.  HOME CARE   If given, take antibiotics as told by your doctor. Finish them even if you start to feel better.  Drink enough fluids to keep your pee (urine) clear or pale yellow.  Avoid tea, drinks with caffeine, and bubbly (carbonated) drinks.  Pee often. Avoid holding your pee in for a long time.  Pee before and after having sex (intercourse).  Wipe from front to back after you poop (bowel movement) if you are a woman. Use each tissue only once. GET HELP RIGHT AWAY IF:   You have back pain.  You have lower belly (abdominal) pain.  You have chills.  You feel sick to your stomach (nauseous).  You throw up (vomit).  Your burning or discomfort with peeing does not go away.  You have a fever.  Your symptoms are not better in 3 days. MAKE SURE YOU:   Understand these instructions.  Will watch your condition.  Will get help right away if you are not doing well or get worse. Document Released: 01/01/2008 Document Revised: 04/08/2012 Document Reviewed: 02/13/2012 [facility] Patient Information 2015 [employer] [place]. This information  is not intended to replace advice given to you by your health care provider. Make sure you discuss any questions you have with your health care provider.

## 2018-07-12 LAB — URINE CULTURE: Culture: <10000 — AB

## 2018-07-13 LAB — URINE CYTOLOGY ANCILLARY ONLY
Chlamydia: NEGATIVE
Neisseria Gonorrhea: NEGATIVE
Trichomonas: NEGATIVE

## 2018-07-14 LAB — URINE CYTOLOGY ANCILLARY ONLY
Bacterial vaginitis: NEGATIVE
Candida vaginitis: NEGATIVE

## 2020-03-28 IMAGING — DX DG FINGER MIDDLE 2+V*R*
3 series · 3 of 3 positions shown · non-contrast
Comparison: None.

CLINICAL DATA: Third digit injury 1 month ago with persistent pain
and swelling, initial encounter

EXAM:
RIGHT MIDDLE FINGER 2+V

[finger ap]
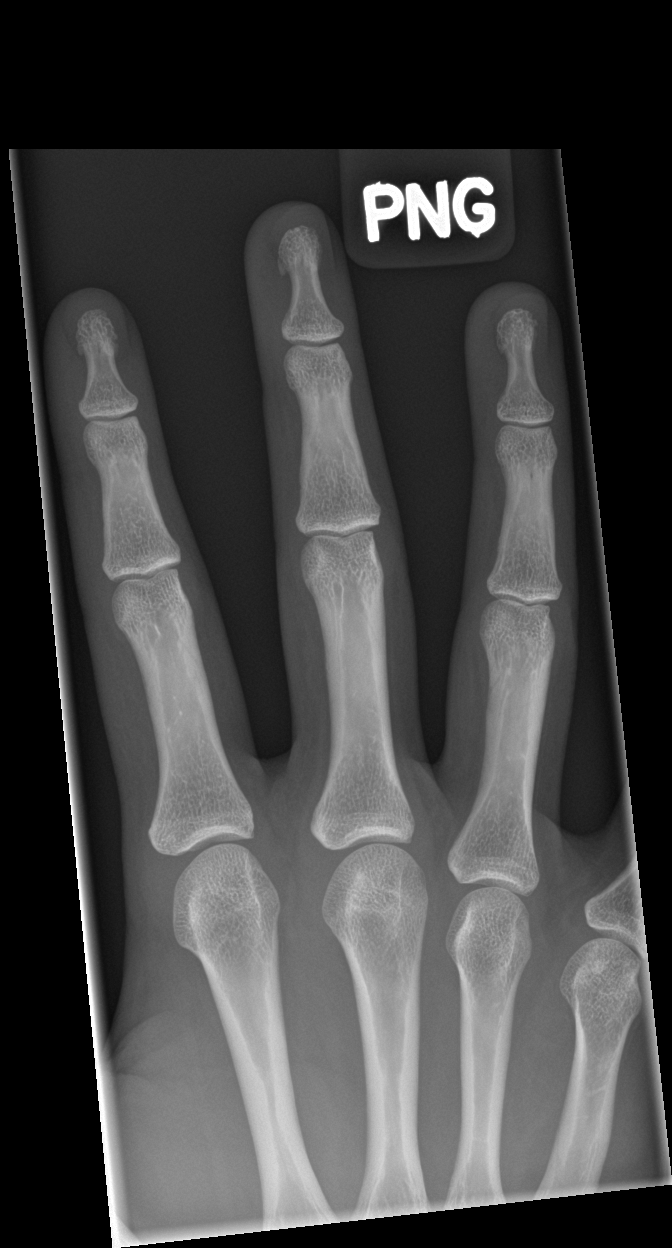

[finger obl]
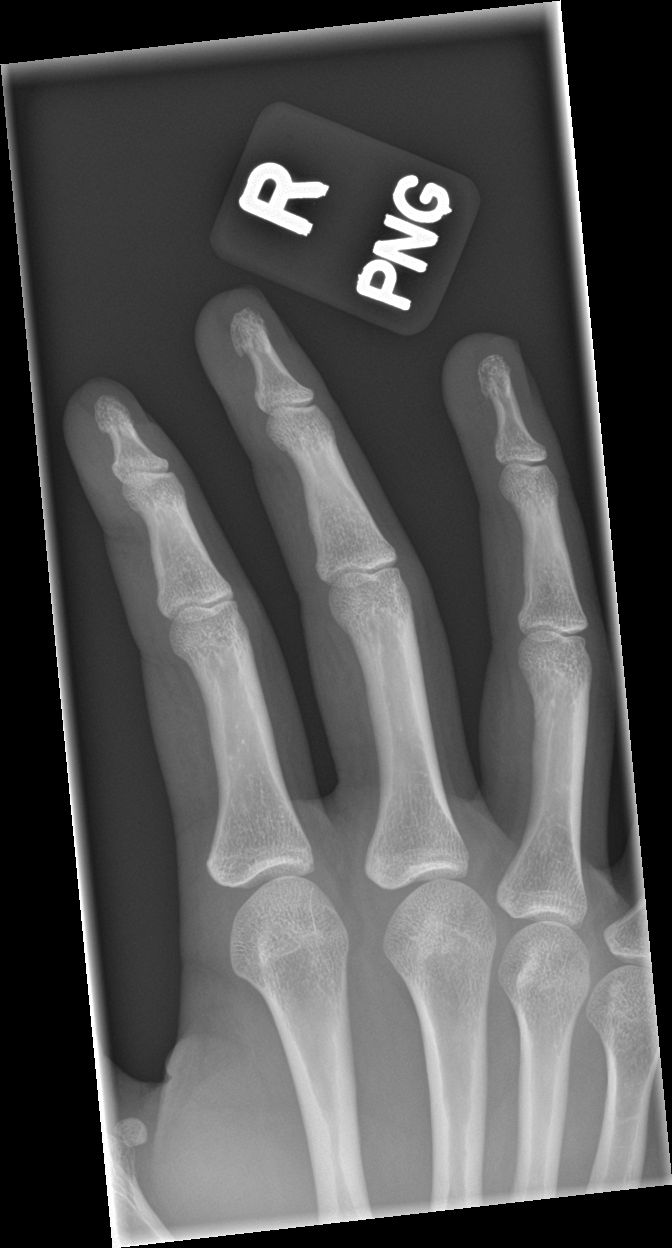

[finger lat]
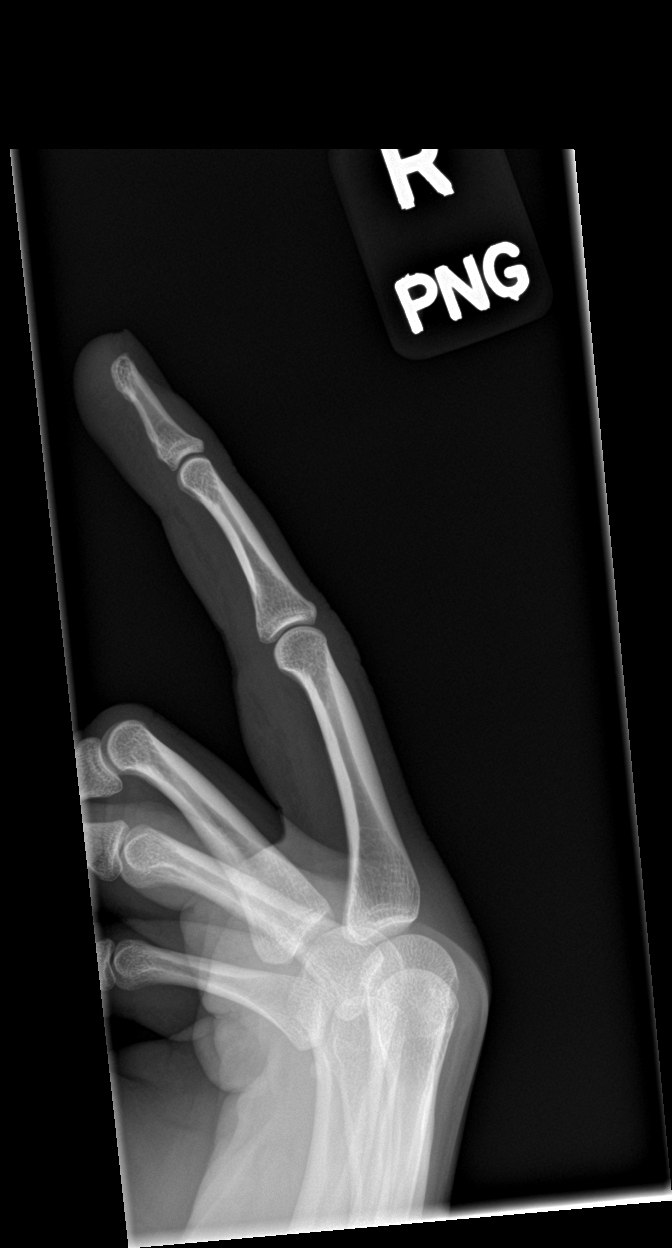

[3 of 3 positions shown; findings below may reference images not displayed]

FINDINGS: There is no evidence of fracture or dislocation. There is no
evidence of arthropathy or other focal bone abnormality. Soft
tissues are unremarkable.
IMPRESSION: No acute abnormality noted.

## 2024-05-05 ENCOUNTER — Ambulatory Visit (HOSPITAL_COMMUNITY): Admission: EM | Admit: 2024-05-05 | Discharge: 2024-05-05 | Disposition: A | Discharge status: Home / Self Care

## 2024-05-05 ENCOUNTER — Encounter (HOSPITAL_COMMUNITY): Payer: Self-pay

## 2024-05-05 DIAGNOSIS — R6889 Other general symptoms and signs: Secondary | ICD-10-CM | POA: Diagnosis not present

## 2024-05-05 LAB — POC COVID19/FLU A&B COMBO
Covid Antigen, POC: NEGATIVE
Influenza A Antigen, POC: NEGATIVE
Influenza B Antigen, POC: NEGATIVE
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
## 2024-05-05 NOTE — Discharge Instructions (Signed)
 Your flu and covid tests are negative. You probably have another viral cold type illness going on, like parainfluenza. Please follow up with your primary care doctor on Friday if symptoms persist You may take any over the counter medications for your symptoms as noted. It is OK to take Claritin for the hives if they come back.

## 2024-05-05 NOTE — ED Triage Notes (Signed)
 Patient reports that she has been having fatigue, hives, nausea, cough x 3 days.  Patient reports taking Motrin for her symptoms.

## 2024-08-26 ENCOUNTER — Ambulatory Visit
Admission: EM | Admit: 2024-08-26 | Discharge: 2024-08-26 | Disposition: A | Discharge status: Home / Self Care | Attending: Emergency Medicine | Admitting: Emergency Medicine

## 2024-08-26 DIAGNOSIS — J0100 Acute maxillary sinusitis, unspecified: Secondary | ICD-10-CM | POA: Diagnosis not present

## 2024-08-26 MED ORDER — AMOXICILLIN-POT CLAVULANATE 875-125 MG PO TABS: 1.0000 | ORAL_TABLET | Freq: Two times a day (BID) | ORAL | 0 refills | Status: AC

## 2024-08-26 NOTE — Discharge Instructions (Addendum)
 Take the Augmentin  as directed for your sinus infection.  Follow-up with your primary care provider if your symptoms are not improving.
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
## 2024-08-26 NOTE — ED Triage Notes (Signed)
 Sore throat, congestion, sinus pressure, fatigue, headache, cough x 5 days. Taking aleve.
